# Patient Record
Sex: Female | Born: 1950 | Race: White | Hispanic: No | Marital: Single | Smoking: Former smoker
Health system: Southern US, Community
[De-identification: ages and names within clinical notes are randomized; demographics above are authoritative.]

## PROBLEM LIST (undated history)

## (undated) DIAGNOSIS — M1990 Unspecified osteoarthritis, unspecified site: Secondary | ICD-10-CM

## (undated) DIAGNOSIS — E039 Hypothyroidism, unspecified: Secondary | ICD-10-CM

## (undated) DIAGNOSIS — I10 Essential (primary) hypertension: Secondary | ICD-10-CM

## (undated) DIAGNOSIS — E785 Hyperlipidemia, unspecified: Secondary | ICD-10-CM

## (undated) HISTORY — PX: BASAL CELL CARCINOMA EXCISION: SHX1214

## (undated) HISTORY — PX: TONSILLECTOMY: SUR1361

---

## 2016-02-29 ENCOUNTER — Other Ambulatory Visit: Payer: Self-pay | Admitting: Orthopaedic Surgery

## 2016-03-15 ENCOUNTER — Encounter (HOSPITAL_COMMUNITY)
Admission: RE | Admit: 2016-03-15 | Discharge: 2016-03-15 | Disposition: A | Discharge status: Home / Self Care | Payer: Medicare Other | Source: Ambulatory Visit | Attending: Orthopaedic Surgery | Admitting: Orthopaedic Surgery

## 2016-03-15 ENCOUNTER — Ambulatory Visit (HOSPITAL_COMMUNITY)
Admission: RE | Admit: 2016-03-15 | Discharge: 2016-03-15 | Disposition: A | Discharge status: Home / Self Care | Payer: Medicare Other | Source: Ambulatory Visit | Attending: Orthopaedic Surgery | Admitting: Orthopaedic Surgery

## 2016-03-15 ENCOUNTER — Encounter (HOSPITAL_COMMUNITY): Payer: Self-pay

## 2016-03-15 DIAGNOSIS — Z01818 Encounter for other preprocedural examination: Secondary | ICD-10-CM | POA: Insufficient documentation

## 2016-03-15 DIAGNOSIS — Z01812 Encounter for preprocedural laboratory examination: Secondary | ICD-10-CM | POA: Insufficient documentation

## 2016-03-15 DIAGNOSIS — I700 Atherosclerosis of aorta: Secondary | ICD-10-CM | POA: Diagnosis not present

## 2016-03-15 DIAGNOSIS — Z01810 Encounter for preprocedural cardiovascular examination: Secondary | ICD-10-CM | POA: Diagnosis present

## 2016-03-15 HISTORY — DX: Hypothyroidism, unspecified: E03.9

## 2016-03-15 HISTORY — DX: Hyperlipidemia, unspecified: E78.5

## 2016-03-15 HISTORY — DX: Essential (primary) hypertension: I10

## 2016-03-15 LAB — CBC WITH DIFFERENTIAL/PLATELET
BASOS ABS: 0 10*3/uL (ref 0.0–0.1)
BASOS PCT: 0 %
EOS ABS: 0.1 10*3/uL (ref 0.0–0.7)
Eosinophils Relative: 1 %
HCT: 47.1 % — ABNORMAL HIGH (ref 36.0–46.0)
HEMOGLOBIN: 16 g/dL — AB (ref 12.0–15.0)
Lymphocytes Relative: 24 %
Lymphs Abs: 2.8 10*3/uL (ref 0.7–4.0)
MCH: 32.4 pg (ref 26.0–34.0)
MCHC: 34 g/dL (ref 30.0–36.0)
MCV: 95.3 fL (ref 78.0–100.0)
MONO ABS: 0.6 10*3/uL (ref 0.1–1.0)
MONOS PCT: 5 %
NEUTROS PCT: 70 %
Neutro Abs: 8 10*3/uL — ABNORMAL HIGH (ref 1.7–7.7)
Platelets: 234 10*3/uL (ref 150–400)
RBC: 4.94 MIL/uL (ref 3.87–5.11)
RDW: 12.9 % (ref 11.5–15.5)
WBC: 11.6 10*3/uL — ABNORMAL HIGH (ref 4.0–10.5)

## 2016-03-15 LAB — URINALYSIS, ROUTINE W REFLEX MICROSCOPIC
BACTERIA UA: NONE SEEN
BILIRUBIN URINE: NEGATIVE
Glucose, UA: NEGATIVE mg/dL
KETONES UR: NEGATIVE mg/dL
LEUKOCYTES UA: NEGATIVE
Nitrite: NEGATIVE
PROTEIN: NEGATIVE mg/dL
Specific Gravity, Urine: 1.016 (ref 1.005–1.030)
pH: 6 (ref 5.0–8.0)

## 2016-03-15 LAB — BASIC METABOLIC PANEL
ANION GAP: 11 (ref 5–15)
BUN: 21 mg/dL — ABNORMAL HIGH (ref 6–20)
CALCIUM: 9.4 mg/dL (ref 8.9–10.3)
CO2: 19 mmol/L — AB (ref 22–32)
CREATININE: 0.8 mg/dL (ref 0.44–1.00)
Chloride: 107 mmol/L (ref 101–111)
Glucose, Bld: 101 mg/dL — ABNORMAL HIGH (ref 65–99)
Potassium: 4.2 mmol/L (ref 3.5–5.1)
SODIUM: 137 mmol/L (ref 135–145)

## 2016-03-15 LAB — ABO/RH: ABO/RH(D): O POS

## 2016-03-15 LAB — APTT: APTT: 29 s (ref 24–36)

## 2016-03-15 LAB — PROTIME-INR
INR: 1
PROTHROMBIN TIME: 13.2 s (ref 11.4–15.2)

## 2016-03-15 LAB — SURGICAL PCR SCREEN
MRSA, PCR: NEGATIVE
STAPHYLOCOCCUS AUREUS: NEGATIVE

## 2016-03-15 LAB — TYPE AND SCREEN
ABO/RH(D): O POS
Antibody Screen: NEGATIVE

## 2016-03-15 NOTE — Pre-Procedure Instructions (Signed)
    Tami Baker  03/15/2016      [facility] [address] - [place], [place] - [address] [place] [id number] Phone: [phone] Fax: [phone]    Your procedure is scheduled on March 26, 2016.  [facility].M.  Call this number if you have problems the morning of surgery:  [phone]   Remember:  Do not eat food or drink liquids after midnight Monday 1/15.  Take these medicines the morning of surgery with A SIP OF WATER :levothyroxine (SYNTHROID, LEVOTHROID)   STOP ASPIRIN, HERBAL MEDICATIONS, FISH OIL, NSAID'S (ALEVE, ADVIL, IBUPROFEN) ONE WEEK PRIOR TO SURGERY    Do not wear jewelry, make-up or nail polish.  Do not wear lotions, powders, or perfumes, or deoderant.  Do not shave 48 hours prior to surgery.    Do not bring valuables to the hospital.  [facility] is not responsible for any belongings or valuables.  Contacts, dentures or bridgework may not be worn into surgery.  Leave your suitcase in the car.  After surgery it may be brought to your room.  For patients admitted to the hospital, discharge time will be determined by your treatment team.  Patients discharged the day of surgery will not be allowed to drive home.      Special instructions:  PREPARING FOR SURGERY  Please read over the following fact sheets that you were given. Pain Booklet and Surgical Site Infection Prevention

## 2016-03-15 NOTE — Progress Notes (Deleted)
Pt states he was instructed by Dr Randie Heinzain not to stop plavix.  Voicemail left with [place].

## 2016-03-25 NOTE — H&P (Signed)
TOTAL HIP ADMISSION H&P  Patient is admitted for right total hip arthroplasty.  Subjective:  Chief Complaint: right hip pain  HPI: Tami Baker, 66 y.o. female, has a history of pain and functional disability in the right hip(s) due to arthritis and patient has failed non-surgical conservative treatments for greater than 12 weeks to include NSAID's and/or analgesics, corticosteriod injections, flexibility and strengthening excercises, use of assistive devices, weight reduction as appropriate and activity modification.  Onset of symptoms was gradual starting 5 years ago with gradually worsening course since that time.The patient noted no past surgery on the right hip(s).  Patient currently rates pain in the right hip at 10 out of 10 with activity. Patient has night pain, worsening of pain with activity and weight bearing, trendelenberg gait, pain that interfers with activities of daily living and crepitus. Patient has evidence of subchondral cysts, subchondral sclerosis, periarticular osteophytes and joint space narrowing by imaging studies. This condition presents safety issues increasing the risk of falls. There is no current active infection.  There are no active problems to display for this patient.  Past Medical History:  Diagnosis Date  . Hyperlipemia   . Hypertension   . Hypothyroidism     Past Surgical History:  Procedure Laterality Date  . BASAL CELL CARCINOMA EXCISION Right    right mandible  . TONSILLECTOMY      No prescriptions prior to admission.   Allergies  Allergen Reactions  . Ciprofloxacin Hives and Shortness Of Breath  . Codeine Hives and Shortness Of Breath  . Iodine Hives and Shortness Of Breath  . Ivp Dye [Iodinated Diagnostic Agents] Hives and Shortness Of Breath  . Levaquin [Levofloxacin] Hives and Shortness Of Breath  . Penicillins Hives and Shortness Of Breath    Has patient had a PCN reaction causing immediate rash, facial/tongue/throat swelling, SOB or  lightheadedness with hypotension: Yes Has patient had a PCN reaction causing severe rash involving mucus membranes or skin necrosis: No Has patient had a PCN reaction that required hospitalization No Has patient had a PCN reaction occurring within the last 10 years: Yes If all of the above answers are "NO", then may proceed with Cephalosporin use.   . Shellfish Allergy Hives and Shortness Of Breath    Social History  Substance Use Topics  . Smoking status: Not on file  . Smokeless tobacco: Not on file  . Alcohol use Not on file    No family history on file.   Review of Systems  Musculoskeletal: Positive for joint pain.       Right hip  All other systems reviewed and are negative.   Objective:  Physical Exam  Constitutional: She is oriented to person, place, and time. She appears well-developed and well-nourished.  HENT:  Head: Normocephalic and atraumatic.  Eyes: Pupils are equal, round, and reactive to light.  Neck: Normal range of motion.  Cardiovascular: Normal rate and regular rhythm.   Respiratory: Effort normal.  GI: Soft.  Musculoskeletal:  Right hip has limited motion all of which is somewhat painful.  Her right leg is slightly short compared to the left.  There is some pain across the greater trochanteric bursa as well.  Straight leg raise causes hip pain only.  Sensation and motor function are intact distally.  She has palpable pulses in her feet.  The only scar on her leg is a small one by her knee related to an old laceration.  There is no palpable lymphadenopathy behind her knee or at the  groin.    Neurological: She is alert and oriented to person, place, and time.  Skin: Skin is warm and dry.  Psychiatric: She has a normal mood and affect. Her behavior is normal. Judgment and thought content normal.    Vital signs in last 24 hours:    Labs:   Estimated body mass index is 28.19 kg/m as calculated from the following:   Height as of 03/15/16: 5' 6.5" (1.689  m).   Weight as of 03/15/16: 80.4 kg (177 lb 4.8 oz).   Imaging Review Plain radiographs demonstrate severe degenerative joint disease of the right hip(s). The bone quality appears to be good for age and reported activity level.  Assessment/Plan:  End stage primary arthritis, right hip(s)  The patient history, physical examination, clinical judgement of the provider and imaging studies are consistent with end stage degenerative joint disease of the right hip(s) and total hip arthroplasty is deemed medically necessary. The treatment options including medical management, injection therapy, arthroscopy and arthroplasty were discussed at length. The risks and benefits of total hip arthroplasty were presented and reviewed. The risks due to aseptic loosening, infection, stiffness, dislocation/subluxation,  thromboembolic complications and other imponderables were discussed.  The patient acknowledged the explanation, agreed to proceed with the plan and consent was signed. Patient is being admitted for inpatient treatment for surgery, pain control, PT, OT, prophylactic antibiotics, VTE prophylaxis, progressive ambulation and ADL's and discharge planning.The patient is planning to be discharged home with home health services

## 2016-03-25 NOTE — Anesthesia Preprocedure Evaluation (Addendum)
Anesthesia Evaluation  Patient identified by MRN, date of birth, ID band Patient awake    Reviewed: Allergy & Precautions, NPO status , Patient's Chart, lab work & pertinent test results  History of Anesthesia Complications Negative for: history of anesthetic complications  Airway Mallampati: II  TM Distance: >3 FB Neck ROM: Full    Dental no notable dental hx. (+) Dental Advisory Given   Pulmonary neg pulmonary ROS, former smoker,    Pulmonary exam normal        Cardiovascular hypertension, Pt. on medications Normal cardiovascular exam     Neuro/Psych negative neurological ROS  negative psych ROS   GI/Hepatic negative GI ROS, Neg liver ROS,   Endo/Other  Hypothyroidism   Renal/GU negative Renal ROS     Musculoskeletal negative musculoskeletal ROS (+)   Abdominal   Peds  Hematology negative hematology ROS (+)   Anesthesia Other Findings Day of surgery medications reviewed with the patient.  Reproductive/Obstetrics                            Anesthesia Physical Anesthesia Plan  ASA: II  Anesthesia Plan: MAC and Spinal   Post-op Pain Management:    Induction:   Airway Management Planned: Natural Airway and Simple Face Mask  Additional Equipment:   Intra-op Plan:   Post-operative Plan:   Informed Consent: I have reviewed the patients History and Physical, chart, labs and discussed the procedure including the risks, benefits and alternatives for the proposed anesthesia with the patient or authorized representative who has indicated his/her understanding and acceptance.   Dental advisory given  Plan Discussed with: CRNA, Anesthesiologist and Surgeon  Anesthesia Plan Comments:        Anesthesia Quick Evaluation

## 2016-03-26 ENCOUNTER — Inpatient Hospital Stay (HOSPITAL_COMMUNITY)
Admission: RE | Admit: 2016-03-26 | Discharge: 2016-03-28 | DRG: 470 | Disposition: A | Discharge status: Home Health | Payer: Medicare Other | Source: Ambulatory Visit | Attending: Orthopaedic Surgery | Admitting: Orthopaedic Surgery

## 2016-03-26 ENCOUNTER — Inpatient Hospital Stay (HOSPITAL_COMMUNITY): Payer: Medicare Other

## 2016-03-26 ENCOUNTER — Inpatient Hospital Stay (HOSPITAL_COMMUNITY): Payer: Medicare Other | Admitting: Certified Registered Nurse Anesthetist

## 2016-03-26 ENCOUNTER — Encounter (HOSPITAL_COMMUNITY): Payer: Self-pay | Admitting: *Deleted

## 2016-03-26 ENCOUNTER — Inpatient Hospital Stay (HOSPITAL_COMMUNITY): Payer: Medicare Other | Admitting: Emergency Medicine

## 2016-03-26 ENCOUNTER — Encounter (HOSPITAL_COMMUNITY)
Admission: RE | Disposition: A | Discharge status: Home Health | Payer: Self-pay | Source: Ambulatory Visit | Attending: Orthopaedic Surgery

## 2016-03-26 DIAGNOSIS — Z91041 Radiographic dye allergy status: Secondary | ICD-10-CM

## 2016-03-26 DIAGNOSIS — Z880 Allergy status to penicillin: Secondary | ICD-10-CM

## 2016-03-26 DIAGNOSIS — Z85828 Personal history of other malignant neoplasm of skin: Secondary | ICD-10-CM | POA: Diagnosis not present

## 2016-03-26 DIAGNOSIS — Z91013 Allergy to seafood: Secondary | ICD-10-CM | POA: Diagnosis not present

## 2016-03-26 DIAGNOSIS — M1611 Unilateral primary osteoarthritis, right hip: Principal | ICD-10-CM | POA: Diagnosis present

## 2016-03-26 DIAGNOSIS — E785 Hyperlipidemia, unspecified: Secondary | ICD-10-CM | POA: Diagnosis present

## 2016-03-26 DIAGNOSIS — Z419 Encounter for procedure for purposes other than remedying health state, unspecified: Secondary | ICD-10-CM

## 2016-03-26 DIAGNOSIS — Z881 Allergy status to other antibiotic agents status: Secondary | ICD-10-CM | POA: Diagnosis not present

## 2016-03-26 DIAGNOSIS — Z885 Allergy status to narcotic agent status: Secondary | ICD-10-CM | POA: Diagnosis not present

## 2016-03-26 DIAGNOSIS — E039 Hypothyroidism, unspecified: Secondary | ICD-10-CM | POA: Diagnosis present

## 2016-03-26 DIAGNOSIS — I10 Essential (primary) hypertension: Secondary | ICD-10-CM | POA: Diagnosis present

## 2016-03-26 HISTORY — PX: TOTAL HIP ARTHROPLASTY: SHX124

## 2016-03-26 HISTORY — DX: Unspecified osteoarthritis, unspecified site: M19.90

## 2016-03-26 SURGERY — ARTHROPLASTY, HIP, TOTAL, ANTERIOR APPROACH
Anesthesia: Monitor Anesthesia Care | Site: Hip | Laterality: Right

## 2016-03-26 MED ORDER — BISACODYL 5 MG PO TBEC
5.0000 mg | DELAYED_RELEASE_TABLET | Freq: Every day | ORAL | Status: DC | PRN
  Administered 2016-03-27: 5 mg via ORAL
  Filled 2016-03-26: qty 1

## 2016-03-26 MED ORDER — MIDAZOLAM HCL 2 MG/2ML IJ SOLN
INTRAMUSCULAR | Status: AC
  Filled 2016-03-26: qty 2

## 2016-03-26 MED ORDER — FENTANYL CITRATE (PF) 100 MCG/2ML IJ SOLN
INTRAMUSCULAR | Status: AC
  Filled 2016-03-26: qty 2

## 2016-03-26 MED ORDER — TRAMADOL HCL 50 MG PO TABS
50.0000 mg | ORAL_TABLET | Freq: Four times a day (QID) | ORAL | Status: DC
  Administered 2016-03-26 – 2016-03-27 (×3): 100 mg via ORAL
  Filled 2016-03-26: qty 2
  Filled 2016-03-26: qty 1
  Filled 2016-03-26 (×2): qty 2
  Filled 2016-03-26: qty 1

## 2016-03-26 MED ORDER — LACTATED RINGERS IV SOLN
INTRAVENOUS | Status: DC
  Administered 2016-03-26: 07:00:00 via INTRAVENOUS

## 2016-03-26 MED ORDER — PHENYLEPHRINE 40 MCG/ML (10ML) SYRINGE FOR IV PUSH (FOR BLOOD PRESSURE SUPPORT)
PREFILLED_SYRINGE | INTRAVENOUS | Status: AC
  Filled 2016-03-26: qty 20

## 2016-03-26 MED ORDER — PROMETHAZINE HCL 25 MG/ML IJ SOLN: 6.2500 mg | INTRAMUSCULAR | Status: DC | PRN

## 2016-03-26 MED ORDER — ASPIRIN EC 325 MG PO TBEC
325.0000 mg | DELAYED_RELEASE_TABLET | Freq: Two times a day (BID) | ORAL | Status: DC
  Administered 2016-03-27 – 2016-03-28 (×3): 325 mg via ORAL
  Filled 2016-03-26 (×3): qty 1

## 2016-03-26 MED ORDER — MIDAZOLAM HCL 5 MG/5ML IJ SOLN
INTRAMUSCULAR | Status: DC | PRN
  Administered 2016-03-26: 2 mg via INTRAVENOUS

## 2016-03-26 MED ORDER — HYDROMORPHONE HCL 2 MG/ML IJ SOLN
0.5000 mg | INTRAMUSCULAR | Status: DC | PRN
  Administered 2016-03-26: 1 mg via INTRAVENOUS
  Filled 2016-03-26 (×2): qty 1

## 2016-03-26 MED ORDER — BUPIVACAINE LIPOSOME 1.3 % IJ SUSP
INTRAMUSCULAR | Status: DC | PRN
  Administered 2016-03-26: 20 mL

## 2016-03-26 MED ORDER — LACTATED RINGERS IV SOLN
INTRAVENOUS | Status: DC
  Administered 2016-03-26: 17:00:00 via INTRAVENOUS

## 2016-03-26 MED ORDER — ONDANSETRON HCL 4 MG PO TABS: 4.0000 mg | ORAL_TABLET | Freq: Four times a day (QID) | ORAL | Status: DC | PRN

## 2016-03-26 MED ORDER — TRANEXAMIC ACID 1000 MG/10ML IV SOLN
INTRAVENOUS | Status: DC | PRN
  Administered 2016-03-26: 2000 mg via TOPICAL

## 2016-03-26 MED ORDER — DIPHENHYDRAMINE HCL 12.5 MG/5ML PO ELIX: 12.5000 mg | ORAL_SOLUTION | ORAL | Status: DC | PRN

## 2016-03-26 MED ORDER — METOCLOPRAMIDE HCL 5 MG PO TABS: 5.0000 mg | ORAL_TABLET | Freq: Three times a day (TID) | ORAL | Status: DC | PRN

## 2016-03-26 MED ORDER — LOSARTAN POTASSIUM 50 MG PO TABS
50.0000 mg | ORAL_TABLET | Freq: Every day | ORAL | Status: DC
  Administered 2016-03-27 – 2016-03-28 (×2): 50 mg via ORAL
  Filled 2016-03-26 (×3): qty 1

## 2016-03-26 MED ORDER — CLINDAMYCIN PHOSPHATE 600 MG/50ML IV SOLN
600.0000 mg | Freq: Four times a day (QID) | INTRAVENOUS | Status: AC
  Administered 2016-03-26 (×2): 600 mg via INTRAVENOUS
  Filled 2016-03-26 (×2): qty 50

## 2016-03-26 MED ORDER — BUPIVACAINE-EPINEPHRINE (PF) 0.5% -1:200000 IJ SOLN
INTRAMUSCULAR | Status: DC | PRN
  Administered 2016-03-26: 20 mL

## 2016-03-26 MED ORDER — DOCUSATE SODIUM 100 MG PO CAPS
100.0000 mg | ORAL_CAPSULE | Freq: Two times a day (BID) | ORAL | Status: DC
  Administered 2016-03-26 – 2016-03-28 (×5): 100 mg via ORAL
  Filled 2016-03-26 (×5): qty 1

## 2016-03-26 MED ORDER — BUPIVACAINE-EPINEPHRINE (PF) 0.5% -1:200000 IJ SOLN
INTRAMUSCULAR | Status: AC
  Filled 2016-03-26: qty 30

## 2016-03-26 MED ORDER — PROPOFOL 10 MG/ML IV BOLUS
INTRAVENOUS | Status: DC | PRN
  Administered 2016-03-26: 20 mg via INTRAVENOUS

## 2016-03-26 MED ORDER — CHLORHEXIDINE GLUCONATE 4 % EX LIQD: 60.0000 mL | Freq: Once | CUTANEOUS | Status: DC

## 2016-03-26 MED ORDER — FENTANYL CITRATE (PF) 100 MCG/2ML IJ SOLN
INTRAMUSCULAR | Status: DC | PRN
  Administered 2016-03-26: 100 ug via INTRAVENOUS

## 2016-03-26 MED ORDER — CLINDAMYCIN PHOSPHATE 900 MG/50ML IV SOLN
900.0000 mg | INTRAVENOUS | Status: AC
  Administered 2016-03-26: 900 mg via INTRAVENOUS
  Filled 2016-03-26: qty 50

## 2016-03-26 MED ORDER — POTASSIUM GLUCONATE 550 MG PO TABS: 550.0000 mg | ORAL_TABLET | Freq: Every day | ORAL | Status: DC

## 2016-03-26 MED ORDER — METHOCARBAMOL 1000 MG/10ML IJ SOLN
500.0000 mg | Freq: Four times a day (QID) | INTRAVENOUS | Status: DC | PRN
  Filled 2016-03-26: qty 5

## 2016-03-26 MED ORDER — METHOCARBAMOL 500 MG PO TABS
ORAL_TABLET | ORAL | Status: AC
  Filled 2016-03-26: qty 1

## 2016-03-26 MED ORDER — MENTHOL 3 MG MT LOZG: 1.0000 | LOZENGE | OROMUCOSAL | Status: DC | PRN

## 2016-03-26 MED ORDER — ALUM & MAG HYDROXIDE-SIMETH 200-200-20 MG/5ML PO SUSP: 30.0000 mL | ORAL | Status: DC | PRN

## 2016-03-26 MED ORDER — MAGNESIUM GLUCONATE 500 MG PO TABS
500.0000 mg | ORAL_TABLET | Freq: Every day | ORAL | Status: DC
  Administered 2016-03-26 – 2016-03-28 (×3): 500 mg via ORAL
  Filled 2016-03-26 (×3): qty 1

## 2016-03-26 MED ORDER — LEVOTHYROXINE SODIUM 50 MCG PO TABS
50.0000 ug | ORAL_TABLET | Freq: Every day | ORAL | Status: DC
  Administered 2016-03-27 – 2016-03-28 (×2): 50 ug via ORAL
  Filled 2016-03-26 (×2): qty 1

## 2016-03-26 MED ORDER — PROPOFOL 500 MG/50ML IV EMUL
INTRAVENOUS | Status: DC | PRN
  Administered 2016-03-26: 50 ug/kg/min via INTRAVENOUS

## 2016-03-26 MED ORDER — PHENOL 1.4 % MT LIQD: 1.0000 | OROMUCOSAL | Status: DC | PRN

## 2016-03-26 MED ORDER — TRANEXAMIC ACID 1000 MG/10ML IV SOLN
2000.0000 mg | Freq: Once | INTRAVENOUS | Status: DC
  Filled 2016-03-26: qty 20

## 2016-03-26 MED ORDER — PHENYLEPHRINE HCL 10 MG/ML IJ SOLN
INTRAMUSCULAR | Status: DC | PRN
  Administered 2016-03-26 (×3): 80 ug via INTRAVENOUS
  Administered 2016-03-26: 120 ug via INTRAVENOUS
  Administered 2016-03-26 (×2): 80 ug via INTRAVENOUS
  Administered 2016-03-26: 40 ug via INTRAVENOUS
  Administered 2016-03-26 (×5): 80 ug via INTRAVENOUS

## 2016-03-26 MED ORDER — PRAVASTATIN SODIUM 20 MG PO TABS
20.0000 mg | ORAL_TABLET | Freq: Every day | ORAL | Status: DC
  Administered 2016-03-26 – 2016-03-27 (×2): 20 mg via ORAL
  Filled 2016-03-26 (×2): qty 1

## 2016-03-26 MED ORDER — EPHEDRINE SULFATE 50 MG/ML IJ SOLN
INTRAMUSCULAR | Status: DC | PRN
  Administered 2016-03-26: 10 mg via INTRAVENOUS
  Administered 2016-03-26 (×4): 5 mg via INTRAVENOUS

## 2016-03-26 MED ORDER — 0.9 % SODIUM CHLORIDE (POUR BTL) OPTIME
TOPICAL | Status: DC | PRN
  Administered 2016-03-26: 1000 mL

## 2016-03-26 MED ORDER — ACETAMINOPHEN 650 MG RE SUPP: 650.0000 mg | Freq: Four times a day (QID) | RECTAL | Status: DC | PRN

## 2016-03-26 MED ORDER — HYDROMORPHONE HCL 1 MG/ML IJ SOLN
0.2500 mg | INTRAMUSCULAR | Status: DC | PRN
  Administered 2016-03-26: 0.5 mg via INTRAVENOUS

## 2016-03-26 MED ORDER — HYDROCHLOROTHIAZIDE 12.5 MG PO CAPS
12.5000 mg | ORAL_CAPSULE | Freq: Every day | ORAL | Status: DC
  Administered 2016-03-27 – 2016-03-28 (×2): 12.5 mg via ORAL
  Filled 2016-03-26 (×3): qty 1

## 2016-03-26 MED ORDER — METOCLOPRAMIDE HCL 5 MG/ML IJ SOLN
5.0000 mg | Freq: Three times a day (TID) | INTRAMUSCULAR | Status: DC | PRN
  Administered 2016-03-26: 10 mg via INTRAVENOUS
  Filled 2016-03-26: qty 2

## 2016-03-26 MED ORDER — METHOCARBAMOL 500 MG PO TABS
500.0000 mg | ORAL_TABLET | Freq: Four times a day (QID) | ORAL | Status: DC | PRN
  Administered 2016-03-26 – 2016-03-28 (×6): 500 mg via ORAL
  Filled 2016-03-26 (×5): qty 1

## 2016-03-26 MED ORDER — BUPIVACAINE LIPOSOME 1.3 % IJ SUSP
20.0000 mL | INTRAMUSCULAR | Status: DC
  Filled 2016-03-26: qty 20

## 2016-03-26 MED ORDER — HYDROMORPHONE HCL 1 MG/ML IJ SOLN
INTRAMUSCULAR | Status: AC
  Filled 2016-03-26: qty 0.5

## 2016-03-26 MED ORDER — TRANEXAMIC ACID 1000 MG/10ML IV SOLN
1000.0000 mg | Freq: Once | INTRAVENOUS | Status: AC
  Administered 2016-03-26: 1000 mg via INTRAVENOUS
  Filled 2016-03-26: qty 10

## 2016-03-26 MED ORDER — ONDANSETRON HCL 4 MG/2ML IJ SOLN
4.0000 mg | Freq: Four times a day (QID) | INTRAMUSCULAR | Status: DC | PRN
  Administered 2016-03-26: 4 mg via INTRAVENOUS
  Filled 2016-03-26: qty 2

## 2016-03-26 MED ORDER — TRANEXAMIC ACID 1000 MG/10ML IV SOLN
1000.0000 mg | INTRAVENOUS | Status: AC
  Administered 2016-03-26: 1000 mg via INTRAVENOUS
  Filled 2016-03-26: qty 10

## 2016-03-26 MED ORDER — LOSARTAN POTASSIUM-HCTZ 50-12.5 MG PO TABS: 1.0000 | ORAL_TABLET | Freq: Every day | ORAL | Status: DC

## 2016-03-26 MED ORDER — EPHEDRINE 5 MG/ML INJ
INTRAVENOUS | Status: AC
  Filled 2016-03-26: qty 10

## 2016-03-26 MED ORDER — ACETAMINOPHEN 325 MG PO TABS
650.0000 mg | ORAL_TABLET | Freq: Four times a day (QID) | ORAL | Status: DC | PRN
  Administered 2016-03-27 – 2016-03-28 (×2): 650 mg via ORAL
  Filled 2016-03-26 (×2): qty 2

## 2016-03-26 MED ORDER — BUPIVACAINE IN DEXTROSE 0.75-8.25 % IT SOLN
INTRATHECAL | Status: DC | PRN
  Administered 2016-03-26: 15 mg via INTRATHECAL

## 2016-03-26 SURGICAL SUPPLY — 47 items
BAG DECANTER FOR FLEXI CONT (MISCELLANEOUS) ×3 IMPLANT
BLADE SAW SGTL 18X1.27X75 (BLADE) ×2 IMPLANT
BLADE SAW SGTL 18X1.27X75MM (BLADE) ×1
CAPT HIP TOTAL 2 ×3 IMPLANT
CELLS DAT CNTRL 66122 CELL SVR (MISCELLANEOUS) ×1 IMPLANT
CHLORAPREP W/TINT 26ML (MISCELLANEOUS) ×3 IMPLANT
CLOSURE WOUND 1/2 X4 (GAUZE/BANDAGES/DRESSINGS) ×1
COVER PERINEAL POST (MISCELLANEOUS) ×3 IMPLANT
COVER SURGICAL LIGHT HANDLE (MISCELLANEOUS) ×3 IMPLANT
DRAPE C-ARM 42X72 X-RAY (DRAPES) ×3 IMPLANT
DRAPE INCISE 13X13 STRL (DRAPES) ×3 IMPLANT
DRAPE STERI IOBAN 125X83 (DRAPES) IMPLANT
DRAPE U-SHAPE 47X51 STRL (DRAPES) ×9 IMPLANT
DRSG AQUACEL AG ADV 3.5X10 (GAUZE/BANDAGES/DRESSINGS) ×3 IMPLANT
DURAPREP 26ML APPLICATOR (WOUND CARE) IMPLANT
ELECT BLADE 4.0 EZ CLEAN MEGAD (MISCELLANEOUS) ×3
ELECT REM PT RETURN 9FT ADLT (ELECTROSURGICAL) ×3
ELECTRODE BLDE 4.0 EZ CLN MEGD (MISCELLANEOUS) ×1 IMPLANT
ELECTRODE REM PT RTRN 9FT ADLT (ELECTROSURGICAL) ×1 IMPLANT
FACESHIELD WRAPAROUND (MASK) ×6 IMPLANT
GLOVE BIO SURGEON STRL SZ8 (GLOVE) ×6 IMPLANT
GLOVE BIOGEL PI IND STRL 8 (GLOVE) ×2 IMPLANT
GLOVE BIOGEL PI INDICATOR 8 (GLOVE) ×4
GOWN STRL REUS W/ TWL LRG LVL3 (GOWN DISPOSABLE) ×1 IMPLANT
GOWN STRL REUS W/ TWL XL LVL3 (GOWN DISPOSABLE) ×2 IMPLANT
GOWN STRL REUS W/TWL LRG LVL3 (GOWN DISPOSABLE) ×2
GOWN STRL REUS W/TWL XL LVL3 (GOWN DISPOSABLE) ×4
KIT BASIN OR (CUSTOM PROCEDURE TRAY) ×3 IMPLANT
KIT ROOM TURNOVER OR (KITS) ×3 IMPLANT
MANIFOLD NEPTUNE II (INSTRUMENTS) ×3 IMPLANT
NEEDLE HYPO 22GX1.5 SAFETY (NEEDLE) ×3 IMPLANT
NS IRRIG 1000ML POUR BTL (IV SOLUTION) ×3 IMPLANT
PACK TOTAL JOINT (CUSTOM PROCEDURE TRAY) ×3 IMPLANT
PAD ARMBOARD 7.5X6 YLW CONV (MISCELLANEOUS) ×3 IMPLANT
RTRCTR WOUND ALEXIS 18CM MED (MISCELLANEOUS) ×3
STRIP CLOSURE SKIN 1/2X4 (GAUZE/BANDAGES/DRESSINGS) ×2 IMPLANT
SUT ETHIBOND NAB CT1 #1 30IN (SUTURE) ×6 IMPLANT
SUT MNCRL AB 3-0 PS2 27 (SUTURE) ×3 IMPLANT
SUT VIC AB 1 CT1 27 (SUTURE) ×2
SUT VIC AB 1 CT1 27XBRD ANBCTR (SUTURE) ×1 IMPLANT
SUT VIC AB 2-0 CT1 27 (SUTURE) ×2
SUT VIC AB 2-0 CT1 TAPERPNT 27 (SUTURE) ×1 IMPLANT
SUT VLOC 180 0 24IN GS25 (SUTURE) ×3 IMPLANT
SYR 50ML LL SCALE MARK (SYRINGE) ×3 IMPLANT
TOWEL OR 17X24 6PK STRL BLUE (TOWEL DISPOSABLE) ×3 IMPLANT
TOWEL OR 17X26 10 PK STRL BLUE (TOWEL DISPOSABLE) ×3 IMPLANT
TRAY CATH 16FR W/PLASTIC CATH (SET/KITS/TRAYS/PACK) IMPLANT

## 2016-03-26 NOTE — Anesthesia Procedure Notes (Signed)
Spinal  Patient location during procedure: OR Start time: 03/26/2016 7:26 AM End time: 03/26/2016 7:36 AM Staffing Anesthesiologist: Heather RobertsSINGER, Tami Kooyman Performed: anesthesiologist  Preanesthetic Checklist Completed: patient identified, surgical consent, pre-op evaluation, timeout performed, IV checked, risks and benefits discussed and monitors and equipment checked Spinal Block Patient position: sitting Prep: DuraPrep Patient monitoring: cardiac monitor, continuous pulse ox and blood pressure Approach: midline Location: L2-3 Injection technique: single-shot Needle Needle type: Pencan  Needle gauge: 24 G Needle length: 9 cm Additional Notes Functioning IV was confirmed and monitors were applied. Sterile prep and drape, including hand hygiene and sterile gloves were used. The patient was positioned and the spine was prepped. The skin was anesthetized with lidocaine.  Free flow of clear CSF was obtained prior to injecting local anesthetic into the CSF.  The spinal needle aspirated freely following injection.  The needle was carefully withdrawn.  The patient tolerated the procedure well.

## 2016-03-26 NOTE — Evaluation (Signed)
Physical Therapy Evaluation Patient Details Name: Tami Baker Janis MRN: [id number] DOB: 01/29/1951 Today's Date: 03/26/2016   History of Present Illness  Admitted for R THA, WBAT, Direct Ant;  has a past medical history of Hyperlipemia; Hypertension; and Hypothyroidism.    Clinical Impression   Pt is s/p THA resulting in the deficits listed below (see PT Problem List).  Pt will benefit from skilled PT to increase their independence and safety with mobility to allow discharge to the venue listed below.      Follow Up Recommendations Home health PT;Supervision/Assistance - 24 hour    Equipment Recommendations  Rolling walker with 5" wheels;3in1 (PT)    Recommendations for Other Services OT consult     Precautions / Restrictions Precautions Precautions: None Restrictions RLE Weight Bearing: Weight bearing as tolerated      Mobility  Bed Mobility Overal bed mobility: Needs Assistance Bed Mobility: Supine to Sit     Supine to sit: Min guard     General bed mobility comments: Cues for technqiue  Transfers Overall transfer level: Needs assistance Equipment used: Rolling walker (2 wheeled) Transfers: Sit to/from Stand Sit to Stand: Min guard         General transfer comment: Cues for hand placement and sfatey  Ambulation/Gait Ambulation/Gait assistance: Min assist Ambulation Distance (Feet): 10 Feet Assistive device: Rolling walker (2 wheeled) Gait Pattern/deviations: Step-through pattern     General Gait Details: Cues to self-monitor for activity tolerance; Became nauseated during walk  Stairs            Wheelchair Mobility    Modified Rankin (Stroke Patients Only)       Balance                                             Pertinent Vitals/Pain Pain Assessment: 0-10 Pain Score: 3  Pain Location: R hip Pain Descriptors / Indicators: Aching Pain Intervention(s): Monitored during session    Home Living Family/patient expects to  be discharged to:: Private residence Living Arrangements: Spouse/significant other Available Help at Discharge: Family;Available 24 hours/day Type of Home: House Home Access: Stairs to enter Entrance Stairs-Rails: None Entrance Stairs-Number of Steps: 1 Home Layout: Two level;Bed/bath upstairs        Prior Function Level of Independence: Independent               Hand Dominance        Extremity/Trunk Assessment   Upper Extremity Assessment Upper Extremity Assessment: Overall WFL for tasks assessed    Lower Extremity Assessment Lower Extremity Assessment: RLE deficits/detail RLE Deficits / Details: Grossly decr AROM and strength, limited by soreness postop    Cervical / Trunk Assessment Cervical / Trunk Assessment: Normal  Communication   Communication: No difficulties  Cognition Arousal/Alertness: Awake/alert Behavior During Therapy: WFL for tasks assessed/performed Overall Cognitive Status: Within Functional Limits for tasks assessed                      General Comments      Exercises Total Joint Exercises Ankle Circles/Pumps: AROM;Both;10 reps Quad Sets: AROM;Right;5 reps Gluteal Sets: AROM;Both;5 reps Towel Squeeze: AROM;Both;5 reps Heel Slides: AROM;Right;5 reps Hip ABduction/ADduction: AAROM;Right;5 reps   Assessment/Plan    PT Assessment Patient needs continued PT services  PT Problem List Decreased strength;Decreased range of motion;Decreased activity tolerance;Decreased balance;Decreased mobility;Decreased knowledge of use of DME;Decreased knowledge of  precautions;Pain          PT Treatment Interventions DME instruction;Gait training;Stair training;Functional mobility training;Therapeutic activities;Therapeutic exercise;Patient/family education    PT Goals (Current goals can be found in the Care Plan section)  Acute Rehab PT Goals Patient Stated Goal: walk without pain PT Goal Formulation: With patient Time For Goal Achievement:  04/02/16 Potential to Achieve Goals: Good    Frequency 7X/week   Barriers to discharge        Co-evaluation               End of Session Equipment Utilized During Treatment: Gait belt Activity Tolerance: Patient tolerated treatment well (limited by nausea) Patient left: in chair;with call bell/phone within reach;with family/visitor present Nurse Communication: Mobility status         Time: 8119-1478 PT Time Calculation (min) (ACUTE ONLY): 35 min   Charges:   PT Evaluation $PT Eval Low Complexity: 1 Procedure PT Treatments $Gait Training: 8-22 mins   PT G Codes:        Levi Aland 03/26/2016, 5:00 PM  [place], [place]  Acute Rehabilitation Services Pager [phone] Office [phone]

## 2016-03-26 NOTE — Anesthesia Postprocedure Evaluation (Addendum)
Anesthesia Post Note  Patient: Tami Baker  Procedure(s) Performed: Procedure(s) (LRB): TOTAL HIP ARTHROPLASTY ANTERIOR APPROACH (Right)  Patient location during evaluation: PACU Anesthesia Type: MAC Level of consciousness: awake and alert Pain management: pain level controlled Vital Signs Assessment: post-procedure vital signs reviewed and stable Respiratory status: spontaneous breathing and respiratory function stable Cardiovascular status: blood pressure returned to baseline and stable Postop Assessment: spinal receding Anesthetic complications: no       Last Vitals:  Vitals:   03/26/16 0957 03/26/16 1012  BP: (!) 107/58 (!) 112/55  Pulse: 60 (!) 50  Resp: (!) 22 17  Temp:      Last Pain:  Vitals:   03/26/16 0625  TempSrc: Oral  PainSc:                  Donie Lemelin DANIEL

## 2016-03-26 NOTE — Transfer of Care (Signed)
Immediate Anesthesia Transfer of Care Note  Patient: Tami Baker  Procedure(s) Performed: Procedure(s): TOTAL HIP ARTHROPLASTY ANTERIOR APPROACH (Right)  Patient Location: PACU  Anesthesia Type:MAC and Spinal  Level of Consciousness: awake, alert , oriented and patient cooperative  Airway & Oxygen Therapy: Patient Spontanous Breathing and Patient connected to nasal cannula oxygen  Post-op Assessment: Report given to RN and Post -op Vital signs reviewed and stable  Post vital signs: Reviewed and stable  Last Vitals:  Vitals:   03/26/16 0625  BP: (!) 151/62  Pulse: (!) 57  Resp: 20  Temp: 36.7 C    Last Pain:  Vitals:   03/26/16 0625  TempSrc: Oral  PainSc:          Complications: No apparent anesthesia complications

## 2016-03-26 NOTE — Op Note (Signed)
PRE-OP DIAGNOSIS:  RIGHT HIP DEGENERATIVE JOINT DISEASE POST-OP DIAGNOSIS:  same PROCEDURE: RIGHT TOTAL HIP ARTHROPLASTY ANTERIOR APPROACH ANESTHESIA:  Spinal SURGEON:  Marcene CorningPeter Tayshaun Kroh MD ASSISTANT:  Elodia FlorenceAndrew Nida PA-C   INDICATIONS FOR PROCEDURE:  The patient is a 66 y.o. female with a long history of a painful hip.  This has persisted despite multiple conservative measures.  The patient has persisted with pain and dysfunction making rest and activity difficult.  A total hip replacement is offered as surgical treatment.  Informed operative consent was obtained after discussion of possible complications including reaction to anesthesia, infection, neurovascular injury, dislocation, DVT, PE, and death.  The importance of the postoperative rehab program to optimize result was stressed with the patient.  SUMMARY OF FINDINGS AND PROCEDURE:  Under general anesthesia through a anterior approach an the Hana table a right THR was performed.  The patient had severe degenerative change and good bone quality.  We used DePuy components to replace the hip and these were size KLA 12 Corail femur capped with a +1 32mm ceramic hip ball.  On the acetabular side we used a size 50 Gription shell with a  plus 4 neutral polyethylene liner.  We did use a hole eliminator.  Elodia FlorenceAndrew Nida PA-C assisted throughout and was invaluable to the completion of the case in that he helped position and retract while I performed the procedure.  He also closed simultaneously to help minimize OR time.  I used fluoroscopy throughout the case to check position of implants and leg lengths and read all of these views myself.  DESCRIPTION OF PROCEDURE:  The patient was taken to the OR suite where general anesthetic was applied.  The patient was then positioned on the Hana table supine.  All bony prominences were appropriately padded.  Prep and drape was then performed in normal sterile fashion.  The patient was given clindamycin preoperative antibiotic  and an appropriate time out was performed.  We then took an anterior approach to the right hip.  Dissection was taken through adipose to the tensor fascia lata fascia.  This structure was incised longitudinally and we dissected in the intermuscular interval just medial to this muscle.  Cobra retractors were placed superior and inferior to the femoral neck superficial to the capsule.  A capsular incision was then made and the retractors were placed along the femoral neck.  Xray was brought in to get a good level for the femoral neck cut which was made with an oscillating saw and osteotome.  The femoral head was removed with a corkscrew.  The acetabulum was exposed and some labral tissues were excised. Reaming was taken to the inside wall of the pelvis and sequentially up to 1 mm smaller than the actual component.  A trial of components was done and then the aforementioned acetabular shell was placed in appropriate tilt and anteversion confirmed by fluoroscopy. The liner was placed along with the hole eliminator and attention was turned to the femur.  The leg was brought down and over into adduction and the elevator bar was used to raise the femur up gently in the wound.  The piriformis was released with care taken to preserve the obturator internus attachment and all of the posterior capsule. The femur was reamed and then broached to the appropriate size.  A trial reduction was done and the aforementioned head and neck assembly gave us the best stability in extension with external rotation.  Leg lengths were felt to be about equal by fluoroscopic exam.  The trial components were removed and the wound irrigated.  We then placed the femoral component in appropriate anteversion.  The head was applied to a dry stem neck and the hip again reduced.  It was again stable in the aforementioned position.  The would was irrigated again followed by re-approximation of anterior capsule with ethibond suture. Tensor fascia was  repaired with V-loc suture  followed by deep closure with #O and #2 undyed vicryl.  Skin was closed with subQ stitch and steristrips followed by a sterile dressing.  EBL and IOF can be obtained from anesthesia records.  DISPOSITION:  The patient was extubated in the OR and taken to PACU in stable condition to be admitted to the Orthopedic Surgery for appropriate post-op care to include perioperative antibiotics and DVT prophylaxis.

## 2016-03-26 NOTE — Interval H&P Note (Signed)
History and Physical Interval Note:  03/26/2016 7:04 AM  Tami Baker  has presented today for surgery, with the diagnosis of RIGHT HIP DEGENERATIVE JOINT DISEASE  The various methods of treatment have been discussed with the patient and family. After consideration of risks, benefits and other options for treatment, the patient has consented to  Procedure(s): TOTAL HIP ARTHROPLASTY ANTERIOR APPROACH (Right) as a surgical intervention .  The patient's history has been reviewed, patient examined, no change in status, stable for surgery.  I have reviewed the patient's chart and labs.  Questions were answered to the patient's satisfaction.     Shandora Koogler G

## 2016-03-27 ENCOUNTER — Encounter (HOSPITAL_COMMUNITY): Payer: Self-pay | Admitting: General Practice

## 2016-03-27 NOTE — Progress Notes (Signed)
Physical Therapy Treatment Patient Details Name: Tami Baker MRN: [id number] DOB: 10-18-1950 Today's Date: 03/27/2016    History of Present Illness Admitted for R THA, WBAT, Direct Ant;  has a past medical history of Hyperlipemia; Hypertension; and Hypothyroidism.      PT Comments    Feeling much better today and much better able to progress ambulation and therex; initiated stair training and discussed car transfer into a Jeep; on track for dc home tomorrow; Took a closer look at R ankle movement; full passive ROM into calf stretch; limited active ankle dorsiflexion; we discussed monitoring ankle range, and self calf stretch combined with active ankle dorsiflexion work  Follow Up Recommendations  Home health PT;Supervision/Assistance - 24 hour     Equipment Recommendations  Rolling walker with 5" wheels;3in1 (PT)    Recommendations for Other Services       Precautions / Restrictions Precautions Precautions: None Restrictions RLE Weight Bearing: Weight bearing as tolerated    Mobility  Bed Mobility Overal bed mobility: Needs Assistance Bed Mobility: Rolling;Sidelying to Sit Rolling: Supervision Sidelying to sit: Min guard       General bed mobility comments: Employed rolling and sidelie to sit technique to give pt more options for getting in and out of the bed; verbal and demo cues for technique  Transfers Overall transfer level: Needs assistance Equipment used: Rolling walker (2 wheeled) Transfers: Sit to/from Stand Sit to Stand: Min guard (without physical contact)         General transfer comment: Cues for hand placement and sfatey  Ambulation/Gait Ambulation/Gait assistance: Min guard Ambulation Distance (Feet): 60 Feet Assistive device: Rolling walker (2 wheeled) Gait Pattern/deviations: Step-through pattern, Decr Heel strike R     General Gait Details: Cues to self-monitor for activity tolerance; noted decr R ankle dorsiflexion with decr heel  strike   Stairs Stairs: Yes   Stair Management: One rail Right;Sideways;Step to pattern Number of Stairs: 2 General stair comments: verbal and demo cues for sequence and technique  Wheelchair Mobility    Modified Rankin (Stroke Patients Only)       Balance                                    Cognition Arousal/Alertness: Awake/alert Behavior During Therapy: WFL for tasks assessed/performed Overall Cognitive Status: Within Functional Limits for tasks assessed                      Exercises Total Joint Exercises Ankle Circles/Pumps: AROM;Both;10 reps (With noted decr active ankle dorsiflexion R) Quad Sets: AROM;Both;10 reps Gluteal Sets: AROM;Both;10 reps Towel Squeeze: AROM;Both;10 reps Heel Slides: AAROM;AROM;Right;10 reps Hip ABduction/ADduction: AROM;Right;10 reps Bridges: AROM;Both;10 reps    General Comments        Pertinent Vitals/Pain Pain Assessment: 0-10 Pain Score: 3  Pain Location: R hip Pain Descriptors / Indicators: Aching Pain Intervention(s): Monitored during session    Home Living                      Prior Function            PT Goals (current goals can now be found in the care plan section) Acute Rehab PT Goals Patient Stated Goal: walk without pain PT Goal Formulation: With patient Time For Goal Achievement: 04/02/16 Potential to Achieve Goals: Good Progress towards PT goals: Progressing toward goals    Frequency    7X/week  PT Plan Current plan remains appropriate    Co-evaluation             End of Session Equipment Utilized During Treatment: Gait belt Activity Tolerance: Patient tolerated treatment well Patient left: in chair;with call bell/phone within reach;with family/visitor present     Time: [id number] PT Time Calculation (min) (ACUTE ONLY): 42 min  Charges:  $Gait Training: 23-37 mins $Therapeutic Exercise: 8-22 mins                    G Codes:      Levi AlandHolly H  Jackquelyn Sundberg 03/27/2016, 10:29 AM  Van ClinesHolly Lisel Siegrist, PT  Acute Rehabilitation Services Pager [phone] Office [phone]

## 2016-03-27 NOTE — Evaluation (Signed)
Occupational Therapy Evaluation Patient Details Name: Tami Baker MRN: [id number] DOB: 07/13/1950 Today's Date: 03/27/2016    History of Present Illness Admitted for R THA, WBAT, Direct Ant;  has a past medical history of Hyperlipemia; Hypertension; and Hypothyroidism.     Clinical Impression   Pt with decline in function and safety with ADLs and ADL mobility with decreased balance and endurance. Pt would benefit from acute OT services to addrress impairments to increase level of function and safety    Follow Up Recommendations  Supervision - Intermittent    Equipment Recommendations  3 in 1 bedside commode;Other (comment);Tub/shower seat Lexicographer(reacher)    Recommendations for Other Services       Precautions / Restrictions Precautions Precautions: None Restrictions Weight Bearing Restrictions: Yes RLE Weight Bearing: Weight bearing as tolerated      Mobility Bed Mobility Overal bed mobility: Needs Assistance Bed Mobility: Rolling;Sidelying to Sit Rolling: Supervision Sidelying to sit: Min guard       General bed mobility comments: pt up in recliner  Transfers Overall transfer level: Needs assistance Equipment used: Rolling walker (2 wheeled) Transfers: Sit to/from Stand Sit to Stand: Min guard         General transfer comment: Cues for hand placement and sfatey    Balance                                            ADL Overall ADL's : Needs assistance/impaired     Grooming: Wash/dry hands;Wash/dry face;Standing;Min guard   Upper Body Bathing: Set up;Sitting   Lower Body Bathing: Moderate assistance   Upper Body Dressing : Set up;Sitting   Lower Body Dressing: Moderate assistance   Toilet Transfer: Min guard;Ambulation;RW;Comfort height toilet;Cueing for safety   Toileting- Clothing Manipulation and Hygiene: Minimal assistance;Sit to/from stand   Tub/ Shower Transfer: Min guard;3 in 1;Ambulation;Rolling walker;Cueing for  safety   Functional mobility during ADLs: Min guard;Cueing for safety       Vision Vision Assessment?: No apparent visual deficits          Pertinent Vitals/Pain Pain Assessment: 0-10 Pain Score: 3  Pain Location: R hip Pain Descriptors / Indicators: Aching;Sore Pain Intervention(s): Limited activity within patient's tolerance;Monitored during session;Premedicated before session;Repositioned;Ice applied     Hand Dominance Right   Extremity/Trunk Assessment Upper Extremity Assessment Upper Extremity Assessment: Overall WFL for tasks assessed   Lower Extremity Assessment Lower Extremity Assessment: Defer to PT evaluation   Cervical / Trunk Assessment Cervical / Trunk Assessment: Normal   Communication Communication Communication: No difficulties   Cognition Arousal/Alertness: Awake/alert Behavior During Therapy: WFL for tasks assessed/performed Overall Cognitive Status: Within Functional Limits for tasks assessed                     General Comments   pt pleasant and cooperative                Home Living Family/patient expects to be discharged to:: Private residence Living Arrangements: Spouse/significant other Available Help at Discharge: Family;Available 24 hours/day Type of Home: House Home Access: Stairs to enter Enter[employer] CorporationEntrance Stairs-Number of Steps: 1 Entrance Stairs-Rails: None Home Layout: Two level;Bed/bath upstairs Alternate Level Stairs-Number of Steps: 16 Alternate Level Stairs-Rails: Left Bathroom Shower/Tub: Tub/shower unit;Walk-in shower   Bathroom Toilet: Standard     Home Equipment: None          Prior Functioning/Environment  Level of Independence: Independent                 OT Problem List: Impaired balance (sitting and/or standing);Pain;Decreased activity tolerance;Decreased knowledge of use of DME or AE   OT Treatment/Interventions: Self-care/ADL training;DME and/or AE instruction;Therapeutic  activities;Patient/family education    OT Goals(Current goals can be found in the care plan section) Acute Rehab OT Goals Patient Stated Goal: walk without pain OT Goal Formulation: With patient Time For Goal Achievement: 04/03/16 Potential to Achieve Goals: Good ADL Goals Pt Will Perform Grooming: with supervision;with set-up;with caregiver independent in assisting;standing Pt Will Perform Lower Body Bathing: with min assist;with caregiver independent in assisting;sitting/lateral leans;sit to/from stand Pt Will Perform Lower Body Dressing: with min assist;with caregiver independent in assisting;sitting/lateral leans;sit to/from stand Pt Will Transfer to Toilet: with supervision;ambulating;grab bars Pt Will Perform Toileting - Clothing Manipulation and hygiene: with min guard assist;with caregiver independent in assisting;sit to/from stand Pt Will Perform Tub/Shower Transfer: with supervision;ambulating;rolling walker;shower seat;3 in 1;with caregiver independent in assisting  OT Frequency: Min 2X/week   Barriers to D/C:    no barriers                     End of Session Equipment Utilized During Treatment: Gait belt;Rolling walker;Other (comment) (3 in 1)  Activity Tolerance: Patient tolerated treatment well Patient left: in chair;with call bell/phone within reach;with family/visitor present   Time: 1610-9604 OT Time Calculation (min): 27 min Charges:  OT General Charges $OT Visit: 1 Procedure OT Evaluation $OT Eval Moderate Complexity: 1 Procedure OT Treatments $Therapeutic Activity: 8-22 mins G-Codes:    Galen Manila 03/27/2016, 1:55 PM

## 2016-03-27 NOTE — Care Management Note (Signed)
Case Management Note  Patient Details  Name: Tami Baker MRN: [id number] Date of Birth: 12/27/1950  Subjective/Objective:  66 yr old female s/p right total hip arthroplasty.                   Action/Plan: Case manager spoke with patient concerning Home Health and DME needs. [facility] [place], no changes. CM has faxed orders and demographiscs,F2F [facility] [place] at [phone]. DME has been delivered to patient's room. She will have support at discharge.   Expected Discharge Date:    03/28/16              Expected Discharge Plan:  Home w Home Health Services  In-House Referral:     Discharge planning Services  CM Consult  Post Acute Care Choice:  Home Health, Durable Medical Equipment Choice offered to:  Patient  DME Arranged:  3-N-1, Walker rolling DME Agency:  [employer]  HH Arranged:  PT HH Agency:  (S) Other - See comment ([facility] in [place])  Status of Service:  Completed, signed off  If discussed at Long Length of Stay Meetings, dates discussed:    Additional Comments:  Durenda GuthrieBrady, Audric Venn Naomi, RN 03/27/2016, 12:27 PM

## 2016-03-27 NOTE — Progress Notes (Signed)
Physical Therapy Treatment Patient Details Name: Tami Baker MRN: [id number] DOB: 07/20/1950 Today's Date: 03/27/2016    History of Present Illness Admitted for R THA, WBAT, Direct Ant;  has a past medical history of Hyperlipemia; Hypertension; and Hypothyroidism.      PT Comments    Continuing improvements in gait and activity tolerance; Performed stairs again; She is doing well monitoring her R ankle active dorsiflexion and performing calf stretches; on track for dc home tomorrow  Follow Up Recommendations  Home health PT;Supervision/Assistance - 24 hour     Equipment Recommendations  Rolling walker with 5" wheels;3in1 (PT) (delivered to room)    Recommendations for Other Services       Precautions / Restrictions Precautions Precautions: None Restrictions Weight Bearing Restrictions: Yes RLE Weight Bearing: Weight bearing as tolerated    Mobility  Bed Mobility Overal bed mobility: Needs Assistance Bed Mobility: Sit to Sidelying Rolling: Supervision       Sit to sidelying: Supervision General bed mobility comments: Cues for sit to sidelie technique; performed very well  Transfers Overall transfer level: Needs assistance Equipment used: Rolling walker (2 wheeled) Transfers: Sit to/from Stand Sit to Stand: Supervision         General transfer comment: Cues for hand placement and sfatey  Ambulation/Gait Ambulation/Gait assistance: Supervision Ambulation Distance (Feet): 70 Feet Assistive device: Rolling walker (2 wheeled) Gait Pattern/deviations: Step-through pattern;Decreased dorsiflexion - right     General Gait Details: Cues to self-monitor for activity tolerance; noted decr R ankle dorsiflexion with decr heel strike, but pt gave subjective report of feeling like she had more control   Stairs Stairs: Yes   Stair Management: One rail Right;Sideways;Step to pattern Number of Stairs: 5 General stair comments: More confident this afternoon  Wheelchair  Mobility    Modified Rankin (Stroke Patients Only)       Balance                                    Cognition Arousal/Alertness: Awake/alert Behavior During Therapy: WFL for tasks assessed/performed Overall Cognitive Status: Within Functional Limits for tasks assessed                      Exercises      General Comments        Pertinent Vitals/Pain Pain Assessment: Faces Pain Score: 3  Faces Pain Scale: Hurts a little bit Pain Location: R hip Pain Descriptors / Indicators: Aching;Sore Pain Intervention(s): Monitored during session    Home Living Family/patient expects to be discharged to:: Private residence Living Arrangements: Spouse/significant other Available Help at Discharge: Family;Available 24 hours/day Type of Home: House Home Access: Stairs to enter Entrance Stairs-Rails: None Home Layout: Two level;Bed/bath upstairs Home Equipment: None      Prior Function Level of Independence: Independent          PT Goals (current goals can now be found in the care plan section) Acute Rehab PT Goals Patient Stated Goal: walk without pain PT Goal Formulation: With patient Time For Goal Achievement: 04/02/16 Potential to Achieve Goals: Good Progress towards PT goals: Progressing toward goals    Frequency    7X/week      PT Plan Current plan remains appropriate    Co-evaluation             End of Session           Time: 1443-1510 PT Time  Calculation (min) (ACUTE ONLY): 27 min  Charges:  $Gait Training: 23-37 mins $Therapeutic Activity: 8-22 mins                    G Codes:      Levi Aland 04/11/16, 3:28 PM  Van Clines, PT  Acute Rehabilitation Services Pager [phone] Office [phone]

## 2016-03-27 NOTE — Progress Notes (Signed)
Subjective: 1 Day Post-Op Procedure(s) (LRB): TOTAL HIP ARTHROPLASTY ANTERIOR APPROACH (Right)  Activity level:  wbat Diet tolerance:  ok Voiding:  ok Patient reports pain as mild.    Objective: Vital signs in last 24 hours: Temp:  [97 F (36.1 C)-98.3 F (36.8 C)] 98.3 F (36.8 C) (01/17 0607) Pulse Rate:  [49-75] 71 (01/17 0607) Resp:  [11-22] 16 (01/17 0607) BP: (99-135)/(33-62) 126/52 (01/17 0607) SpO2:  [95 %-100 %] 95 % (01/17 0607)  Labs: No results for input(s): HGB in the last 72 hours. No results for input(s): WBC, RBC, HCT, PLT in the last 72 hours. No results for input(s): NA, K, CL, CO2, BUN, CREATININE, GLUCOSE, CALCIUM in the last 72 hours. No results for input(s): LABPT, INR in the last 72 hours.  Physical Exam:  Neurologically intact ABD soft Neurovascular intact Sensation intact distally Intact pulses distally Dorsiflexion/Plantar flexion intact Incision: dressing C/D/I and no drainage No cellulitis present Compartment soft  Assessment/Plan:  1 Day Post-Op Procedure(s) (LRB): TOTAL HIP ARTHROPLASTY ANTERIOR APPROACH (Right) Advance diet Up with therapy Plan for discharge tomorrow Discharge home with home health if doing well and cleared by PT.  Follow up in office 2 weeks post op Continue on ASA 325mg  BID x 4 weeks post op.  Ismael Karge, Ginger OrganNDREW PAUL 03/27/2016, 7:50 AM

## 2016-03-28 ENCOUNTER — Encounter (HOSPITAL_COMMUNITY): Payer: Self-pay | Admitting: Orthopaedic Surgery

## 2016-03-28 MED ORDER — METHOCARBAMOL 500 MG PO TABS: 500.0000 mg | ORAL_TABLET | Freq: Four times a day (QID) | ORAL | 0 refills | Status: AC | PRN

## 2016-03-28 MED ORDER — ASPIRIN 325 MG PO TBEC: 325.0000 mg | DELAYED_RELEASE_TABLET | Freq: Two times a day (BID) | ORAL | 0 refills | Status: AC

## 2016-03-28 MED ORDER — TRAMADOL HCL 50 MG PO TABS: 50.0000 mg | ORAL_TABLET | Freq: Four times a day (QID) | ORAL | 0 refills | Status: AC

## 2016-03-28 NOTE — Progress Notes (Signed)
Discharge instructions and medications reviewed with patient. Patient verbalized understanding. Patient is dressed and eating lunch before leaving.

## 2016-03-28 NOTE — Progress Notes (Signed)
Physical Therapy Treatment Patient Details Name: Tami Baker MRN: [id number] DOB: 06/19/1950 Today's Date: 03/28/2016    History of Present Illness Admitted for R THA, WBAT, Direct Ant;  has a past medical history of Hyperlipemia; Hypertension; and Hypothyroidism.      PT Comments    Patient continues to progress toward mobility goals. Pt demonstrated decreased R DF however is improving from yesterday. Current plan remains appropriate.   Follow Up Recommendations  Home health PT;Supervision/Assistance - 24 hour     Equipment Recommendations  Rolling walker with 5" wheels;3in1 (PT)    Recommendations for Other Services       Precautions / Restrictions Precautions Precautions: None Restrictions Weight Bearing Restrictions: Yes RLE Weight Bearing: Weight bearing as tolerated    Mobility  Bed Mobility               General bed mobility comments: N/A; pt OOB in chair upon arrival  Transfers Overall transfer level: Needs assistance Equipment used: Rolling walker (2 wheeled) Transfers: Sit to/from Stand Sit to Stand: Supervision         General transfer comment: carry over of safe hand placement  Ambulation/Gait Ambulation/Gait assistance: Supervision Ambulation Distance (Feet): 200 Feet Assistive device: Rolling walker (2 wheeled) Gait Pattern/deviations: Step-through pattern;Decreased dorsiflexion - right     General Gait Details: slow, steady gait; decreased DF R side however improved from yesterday; pt has good safety awareness and ability to clear floor with R foot    Stairs Stairs: Yes   Stair Management: One rail Right;Sideways;Step to pattern Number of Stairs: 5 General stair comments: carry over sequencing and good safety awareness  Wheelchair Mobility    Modified Rankin (Stroke Patients Only)       Balance                                    Cognition Arousal/Alertness: Awake/alert Behavior During Therapy: WFL for tasks  assessed/performed Overall Cognitive Status: Within Functional Limits for tasks assessed                      Exercises Total Joint Exercises Hip ABduction/ADduction: AROM;Right;10 reps;Standing Knee Flexion: AROM;Right;10 reps;Standing Marching in Standing: AROM;Right;10 reps;Standing    General Comments General comments (skin integrity, edema, etc.): given HEP handout and reviewed      Pertinent Vitals/Pain Pain Assessment: Faces Faces Pain Scale: Hurts a little bit Pain Location: R thigh Pain Descriptors / Indicators: Sore;Tightness (numbness/tightness in calf) Pain Intervention(s): Limited activity within patient's tolerance;Monitored during session;Premedicated before session;Repositioned;Ice applied    Home Living                      Prior Function            PT Goals (current goals can now be found in the care plan section) Acute Rehab PT Goals Patient Stated Goal: walk without pain PT Goal Formulation: With patient Time For Goal Achievement: 04/02/16 Potential to Achieve Goals: Good Progress towards PT goals: Progressing toward goals    Frequency    7X/week      PT Plan Current plan remains appropriate    Co-evaluation             End of Session Equipment Utilized During Treatment: Gait belt Activity Tolerance: Patient tolerated treatment well Patient left: in chair;with call bell/phone within reach;with family/visitor present     Time: 1025-1100 PT Time  Calculation (min) (ACUTE ONLY): 35 min  Charges:  $Gait Training: 8-22 mins $Therapeutic Exercise: 8-22 mins                    G Codes:      Derek Mound, PTA Pager: [phone]   03/28/2016, 11:13 AM

## 2016-03-28 NOTE — Discharge Summary (Signed)
Patient ID: Tami Baker MRN: [id number] DOB/AGE: Dec 05, 1950 66 y.o.  Admit date: 03/26/2016 Discharge date: 03/28/2016  Admission Diagnoses:  Principal Problem:   Primary osteoarthritis of right hip   Discharge Diagnoses:  Same  Past Medical History:  Diagnosis Date  . Arthritis    osteo  . Hyperlipemia   . Hypertension   . Hypothyroidism     Surgeries: Procedure(s): TOTAL HIP ARTHROPLASTY ANTERIOR APPROACH on 03/26/2016   Consultants:   Discharged Condition: Improved  Hospital Course: Tami Baker is an 66 y.o. female who was admitted 03/26/2016 for operative treatment ofPrimary osteoarthritis of right hip. Patient has severe unremitting pain that affects sleep, daily activities, and work/hobbies. After pre-op clearance the patient was taken to the operating room on 03/26/2016 and underwent  Procedure(s): TOTAL HIP ARTHROPLASTY ANTERIOR APPROACH.    Patient was given perioperative antibiotics: Anti-infectives    Start     Dose/Rate Route Frequency Ordered Stop   03/26/16 1330  clindamycin (CLEOCIN) IVPB 600 mg     600 mg 100 mL/hr over 30 Minutes Intravenous Every 6 hours 03/26/16 1204 03/26/16 1850   03/26/16 0745  clindamycin (CLEOCIN) IVPB 900 mg     900 mg 100 mL/hr over 30 Minutes Intravenous To Surgery 03/26/16 0738 03/26/16 0740       Patient was given sequential compression devices, early ambulation, and chemoprophylaxis to prevent DVT.  Patient benefited maximally from hospital stay and there were no complications.    Recent vital signs: Patient Vitals for the past 24 hrs:  BP Temp Temp src Pulse Resp SpO2  03/28/16 0527 (!) 108/54 98.2 F (36.8 C) Oral 74 16 98 %  03/27/16 2022 (!) 116/51 98.7 F (37.1 C) Oral 69 16 97 %  03/27/16 1647 (!) 120/58 98.2 F (36.8 C) - 72 16 97 %     Recent laboratory studies: No results for input(s): WBC, HGB, HCT, PLT, NA, K, CL, CO2, BUN, CREATININE, GLUCOSE, INR, CALCIUM in the last 72 hours.  Invalid input(s): PT,  2   Discharge Medications:   Allergies as of 03/28/2016      Reactions   Ciprofloxacin Hives, Shortness Of Breath   Codeine Hives, Shortness Of Breath   Iodine Hives, Shortness Of Breath   Ivp Dye [iodinated Diagnostic Agents] Hives, Shortness Of Breath   Levaquin [levofloxacin] Hives, Shortness Of Breath   Penicillins Hives, Shortness Of Breath   Has patient had a PCN reaction causing immediate rash, facial/tongue/throat swelling, SOB or lightheadedness with hypotension: Yes Has patient had a PCN reaction causing severe rash involving mucus membranes or skin necrosis: No Has patient had a PCN reaction that required hospitalization No Has patient had a PCN reaction occurring within the last 10 years: Yes If all of the above answers are "NO", then may proceed with Cephalosporin use.   Shellfish Allergy Hives, Shortness Of Breath      Medication List    TAKE these medications   acetaminophen 500 MG tablet Commonly known as:  TYLENOL Take 1,000 mg by mouth every 6 (six) hours as needed (for pain.).   aspirin 325 MG EC tablet Take 1 tablet (325 mg total) by mouth 2 (two) times daily after a meal. What changed:  medication strength  how much to take  when to take this   BIOTIN 5000 PO Take 5,000 mcg by mouth daily.   ergocalciferol 50000 units capsule Commonly known as:  VITAMIN D2 Take 50,000 Units by mouth 2 (two) times a week. Tuesday & Friday  Fish Oil 1200 MG Caps Take 1,200 mg by mouth daily.   Flaxseed Oil 1200 MG Caps Take 1,200 mg by mouth daily.   levothyroxine 50 MCG tablet Commonly known as:  SYNTHROID, LEVOTHROID Take 50 mcg by mouth daily before breakfast.   losartan-hydrochlorothiazide 50-12.5 MG tablet Commonly known as:  HYZAAR Take 1 tablet by mouth daily.   magnesium gluconate 500 MG tablet Commonly known as:  MAGONATE Take 500 mg by mouth daily.   methocarbamol 500 MG tablet Commonly known as:  ROBAXIN Take 1 tablet (500 mg total) by  mouth every 6 (six) hours as needed for muscle spasms.   multivitamin with minerals Tabs tablet Take 1 tablet by mouth daily. Centrum   Potassium Gluconate 550 MG Tabs Take 550 mg by mouth daily.   pravastatin 20 MG tablet Commonly known as:  PRAVACHOL Take 20 mg by mouth at bedtime.   Red Yeast Rice Extract 600 MG Caps Take 600 mg by mouth daily.   SUPER B COMPLEX/C PO Take 1 tablet by mouth daily.   traMADol 50 MG tablet Commonly known as:  ULTRAM Take 1-2 tablets (50-100 mg total) by mouth every 6 (six) hours.   Vitamin B12 3000 MCG Subl Place 3,000 mcg under the tongue daily.            Durable Medical Equipment        Start     Ordered   03/26/16 1205  DME Walker rolling  Once    Question:  Patient needs a walker to treat with the following condition  Answer:  Primary osteoarthritis of right hip   03/26/16 1204   03/26/16 1205  DME 3 n 1  Once     03/26/16 1204   03/26/16 1205  DME Bedside commode  Once    Question:  Patient needs a bedside commode to treat with the following condition  Answer:  Primary osteoarthritis of right hip   03/26/16 1204      Diagnostic Studies: Dg Chest 2 View  Result Date: 03/15/2016 CLINICAL DATA:  Preoperative evaluation for RIGHT hip replacement, history hypertension EXAM: CHEST  2 VIEW COMPARISON:  None FINDINGS: Normal heart size, mediastinal contours, and pulmonary vascularity. Atherosclerotic calcification aorta. Subsegmental atelectasis versus scarring at LEFT base. Lungs otherwise clear. No pleural effusion or pneumothorax. Bones appear demineralized. IMPRESSION: Subsegmental atelectasis versus scarring at LEFT base. Aortic atherosclerosis. Electronically Signed   By: Ulyses Southward M.D.   On: 03/15/2016 12:18   Dg C-arm 1-60 Min  Result Date: 03/26/2016 CLINICAL DATA:  Right hip replacement EXAM: DG C-ARM 61-120 MIN; OPERATIVE RIGHT HIP WITH PELVIS COMPARISON:  None FLUOROSCOPY TIME:  33 seconds FINDINGS: Right total hip  arthroplasty without failure or complication. No dislocation. IMPRESSION: Right total hip arthroplasty. Electronically Signed   By: Elige Ko   On: 03/26/2016 09:37   Dg Hip Operative Unilat W Or W/o Pelvis Right  Result Date: 03/26/2016 CLINICAL DATA:  Right hip replacement EXAM: DG C-ARM 61-120 MIN; OPERATIVE RIGHT HIP WITH PELVIS COMPARISON:  None FLUOROSCOPY TIME:  33 seconds FINDINGS: Right total hip arthroplasty without failure or complication. No dislocation. IMPRESSION: Right total hip arthroplasty. Electronically Signed   By: Elige Ko   On: 03/26/2016 09:37    Disposition: Final discharge disposition not confirmed  Discharge Instructions    Call MD / Call 911    Complete by:  As directed    If you experience chest pain or shortness of breath, CALL 911 and be  transported to the hospital emergency room.  If you develope a fever above 101 F, pus (white drainage) or increased drainage or redness at the wound, or calf pain, call your surgeon's office.   Constipation Prevention    Complete by:  As directed    Drink plenty of fluids.  Prune juice may be helpful.  You may use a stool softener, such as Colace (over the counter) 100 mg twice a day.  Use MiraLax (over the counter) for constipation as needed.   Diet - low sodium heart healthy    Complete by:  As directed    Discharge instructions    Complete by:  As directed    INSTRUCTIONS AFTER JOINT REPLACEMENT   Remove items at home which could result in a fall. This includes throw rugs or furniture in walking pathways ICE to the affected joint every three hours while awake for 30 minutes at a time, for at least the first 3-5 days, and then as needed for pain and swelling.  Continue to use ice for pain and swelling. You may notice swelling that will progress down to the foot and ankle.  This is normal after surgery.  Elevate your leg when you are not up walking on it.   Continue to use the breathing machine you got in the hospital  (incentive spirometer) which will help keep your temperature down.  It is common for your temperature to cycle up and down following surgery, especially at night when you are not up moving around and exerting yourself.  The breathing machine keeps your lungs expanded and your temperature down.   DIET:  As you were doing prior to hospitalization, we recommend a well-balanced diet.  DRESSING / WOUND CARE / SHOWERING  You may shower 3 days after surgery, but keep the wounds dry during showering.  You may use an occlusive plastic wrap (Press'n Seal for example), NO SOAKING/SUBMERGING IN THE BATHTUB.  If the bandage gets wet, change with a clean dry gauze.  If the incision gets wet, pat the wound dry with a clean towel.  ACTIVITY  Increase activity slowly as tolerated, but follow the weight bearing instructions below.   No driving for 6 weeks or until further direction given by your physician.  You cannot drive while taking narcotics.  No lifting or carrying greater than 10 lbs. until further directed by your surgeon. Avoid periods of inactivity such as sitting longer than an hour when not asleep. This helps prevent blood clots.  You may return to work once you are authorized by your doctor.     WEIGHT BEARING   Weight bearing as tolerated with assist device (walker, cane, etc) as directed, use it as long as suggested by your surgeon or therapist, typically at least 4-6 weeks.   EXERCISES  Results after joint replacement surgery are often greatly improved when you follow the exercise, range of motion and muscle strengthening exercises prescribed by your doctor. Safety measures are also important to protect the joint from further injury. Any time any of these exercises cause you to have increased pain or swelling, decrease what you are doing until you are comfortable again and then slowly increase them. If you have problems or questions, call your caregiver or physical therapist for advice.    Rehabilitation is important following a joint replacement. After just a few days of immobilization, the muscles of the leg can become weakened and shrink (atrophy).  These exercises are designed to build up the tone and  strength of the thigh and leg muscles and to improve motion. Often times heat used for twenty to thirty minutes before working out will loosen up your tissues and help with improving the range of motion but do not use heat for the first two weeks following surgery (sometimes heat can increase post-operative swelling).   These exercises can be done on a training (exercise) mat, on the floor, on a table or on a bed. Use whatever works the best and is most comfortable for you.    Use music or television while you are exercising so that the exercises are a pleasant break in your day. This will make your life better with the exercises acting as a break in your routine that you can look forward to.   Perform all exercises about fifteen times, three times per day or as directed.  You should exercise both the operative leg and the other leg as well.   Exercises include:   Quad Sets - Tighten up the muscle on the front of the thigh (Quad) and hold for 5-10 seconds.   Straight Leg Raises - With your knee straight (if you were given a brace, keep it on), lift the leg to 60 degrees, hold for 3 seconds, and slowly lower the leg.  Perform this exercise against resistance later as your leg gets stronger.  Leg Slides: Lying on your back, slowly slide your foot toward your buttocks, bending your knee up off the floor (only go as far as is comfortable). Then slowly slide your foot back down until your leg is flat on the floor again.  Angel Wings: Lying on your back spread your legs to the side as far apart as you can without causing discomfort.  Hamstring Strength:  Lying on your back, push your heel against the floor with your leg straight by tightening up the muscles of your buttocks.  Repeat, but this  time bend your knee to a comfortable angle, and push your heel against the floor.  You may put a pillow under the heel to make it more comfortable if necessary.   A rehabilitation program following joint replacement surgery can speed recovery and prevent re-injury in the future due to weakened muscles. Contact your doctor or a physical therapist for more information on knee rehabilitation.    CONSTIPATION  Constipation is defined medically as fewer than three stools per week and severe constipation as less than one stool per week.  Even if you have a regular bowel pattern at home, your normal regimen is likely to be disrupted due to multiple reasons following surgery.  Combination of anesthesia, postoperative narcotics, change in appetite and fluid intake all can affect your bowels.   YOU MUST use at least one of the following options; they are listed in order of increasing strength to get the job done.  They are all available over the counter, and you may need to use some, POSSIBLY even all of these options:    Drink plenty of fluids (prune juice may be helpful) and high fiber foods Colace 100 mg by mouth twice a day  Senokot for constipation as directed and as needed Dulcolax (bisacodyl), take with full glass of water  Miralax (polyethylene glycol) once or twice a day as needed.  If you have tried all these things and are unable to have a bowel movement in the first 3-4 days after surgery call either your surgeon or your primary doctor.    If you experience loose stools or  diarrhea, hold the medications until you stool forms back up.  If your symptoms do not get better within 1 week or if they get worse, check with your doctor.  If you experience "the worst abdominal pain ever" or develop nausea or vomiting, please contact the office immediately for further recommendations for treatment.   ITCHING:  If you experience itching with your medications, try taking only a single pain pill, or even  half a pain pill at a time.  You can also use Benadryl over the counter for itching or also to help with sleep.   TED HOSE STOCKINGS:  Use stockings on both legs until for at least 2 weeks or as directed by physician office. They may be removed at night for sleeping.  MEDICATIONS:  See your medication summary on the "After Visit Summary" that nursing will review with you.  You may have some home medications which will be placed on hold until you complete the course of blood thinner medication.  It is important for you to complete the blood thinner medication as prescribed.  PRECAUTIONS:  If you experience chest pain or shortness of breath - call 911 immediately for transfer to the hospital emergency department.   If you develop a fever greater that 101 F, purulent drainage from wound, increased redness or drainage from wound, foul odor from the wound/dressing, or calf pain - CONTACT YOUR SURGEON.                                                   FOLLOW-UP APPOINTMENTS:  If you do not already have a post-op appointment, please call the office for an appointment to be seen by your surgeon.  Guidelines for how soon to be seen are listed in your "After Visit Summary", but are typically between 1-4 weeks after surgery.  OTHER INSTRUCTIONS:   Knee Replacement:  Do not place pillow under knee, focus on keeping the knee straight while resting. CPM instructions: 0-90 degrees, 2 hours in the morning, 2 hours in the afternoon, and 2 hours in the evening. Place foam block, curve side up under heel at all times except when in CPM or when walking.  DO NOT modify, tear, cut, or change the foam block in any way.  MAKE SURE YOU:  Understand these instructions.  Get help right away if you are not doing well or get worse.    Thank you for letting us be a part of your medical care team.  It is a privilege we respect greatly.  We hope these instructions will help you stay on track for a fast and full recovery!    Increase activity slowly as tolerated    Complete by:  As directed       Follow-up Information    DALLDORF,PETER G, MD. Schedule an appointment as soon as possible for a visit in 2 week(s).   Specialty:  Orthopedic Surgery Contact information: [address] [place] [address] [phone]        [facility] Follow up.   Why:  [facility] will contact you to arrange start date and time for therapy.           Signed: Drema HalonIDA, Careli Luzader PAUL 03/28/2016, 12:20 PM

## 2016-03-28 NOTE — Progress Notes (Signed)
Occupational Therapy Treatment Patient Details Name: Tami Baker MRN: [id number] DOB: 1950-04-26 Today's Date: 03/28/2016    History of present illness Admitted for R THA, WBAT, Direct Ant;  has a past medical history of Hyperlipemia; Hypertension; and Hypothyroidism.     OT comments  Pt making good progress with functional goals. Pt participated in bathing and dressing in bathroom standing at sink. Pt to d/c home today  Follow Up Recommendations  Supervision - Intermittent    Equipment Recommendations  3 in 1 bedside commode;Other (comment);Tub/shower seat Lexicographer)    Recommendations for Other Services      Precautions / Restrictions Precautions Precautions: None Restrictions Weight Bearing Restrictions: Yes RLE Weight Bearing: Weight bearing as tolerated       Mobility Bed Mobility               General bed mobility comments: pt up in recliner  Transfers Overall transfer level: Needs assistance Equipment used: Rolling walker (2 wheeled) Transfers: Sit to/from Stand Sit to Stand: Supervision         General transfer comment: carry over of safe hand placement    Balance                                   ADL Overall ADL's : Needs assistance/impaired     Grooming: Wash/dry hands;Wash/dry face;Standing;Supervision/safety;Set up   Upper Body Bathing: Set up;Supervision/ safety;Standing   Lower Body Bathing: Minimal assistance;Sit to/from stand   Upper Body Dressing : Supervision/safety;Set up;Standing   Lower Body Dressing: Sit to/from stand;Minimal assistance   Toilet Transfer: Ambulation;RW;Comfort height toilet;Supervision/safety   Toileting- Clothing Manipulation and Hygiene: Min guard;Sit to/from stand   Tub/ Engineer, structural: 3 in 1;Ambulation;Rolling walker;Supervision/safety   Functional mobility during ADLs: Supervision/safety;Rolling walker General ADL Comments: pt participated in bathing and dressing standing at sink       Vision                     Perception     Praxis      Cognition   Behavior During Therapy: [facility] for tasks assessed/performed Overall Cognitive Status: Within Functional Limits for tasks assessed                       Extremity/Trunk Assessment   WFL                       General Comments  pt very pleasant and cooperative    Pertinent Vitals/ Pain       Pain Assessment: 0-10 Pain Score: 2  Faces Pain Scale: Hurts a little bit Pain Location: R LE Pain Descriptors / Indicators: Sore Pain Intervention(s): Monitored during session;Premedicated before session;Repositioned                                                          Frequency  Min 2X/week        Progress Toward Goals  OT Goals(current goals can now be found in the care plan section)  Progress towards OT goals: Progressing toward goals  Acute Rehab OT Goals Patient Stated Goal: walk without pain  Plan Discharge plan remains appropriate  End of Session Equipment Utilized During Treatment: Rolling walker;Other (comment) (3 in 1)   Activity Tolerance Patient tolerated treatment well   Patient Left in chair;with call bell/phone within reach;with family/visitor present             Time: [id number] OT Time Calculation (min): 25 min  Charges: OT General Charges $OT Visit: 1 Procedure OT Treatments $Self Care/Home Management : 23-37 mins  Galen ManilaSpencer, Lashaya Kienitz Jeanette 03/28/2016, 12:52 PM

## 2016-08-10 ENCOUNTER — Encounter (HOSPITAL_COMMUNITY): Payer: Self-pay | Admitting: Orthopaedic Surgery

## 2016-08-10 NOTE — Addendum Note (Signed)
Addendum  created 08/10/16 0820 by Heather RobertsSinger, Amberlea Spagnuolo, MD   Sign clinical note

## 2017-08-11 ENCOUNTER — Encounter (INDEPENDENT_AMBULATORY_CARE_PROVIDER_SITE_OTHER): Payer: Medicare Other | Admitting: Neurology

## 2017-08-11 ENCOUNTER — Ambulatory Visit (INDEPENDENT_AMBULATORY_CARE_PROVIDER_SITE_OTHER): Payer: Medicare Other | Admitting: Neurology

## 2017-08-11 DIAGNOSIS — R202 Paresthesia of skin: Secondary | ICD-10-CM

## 2017-08-11 DIAGNOSIS — Z0289 Encounter for other administrative examinations: Secondary | ICD-10-CM

## 2017-08-11 NOTE — Procedures (Signed)
     HISTORY:   Tami Baker is a 67 year old patient with a history of a total hip replacement on the right that was done in January 2018.  Immediately after the procedure, the patient was noted to have a foot drop with severe pain at the right lateral knee.  Over time, the foot drop has improved, the knee pain is improved and she has some residual numbness in the dorsum of the right foot laterally and in the lateral ankle.  The patient is being evaluated for a possible neuropathy or a radiculopathy.  NERVE CONDUCTION STUDIES:  Nerve conduction studies were performed on both lower extremities. The distal motor latencies and motor amplitudes for the peroneal and posterior tibial nerves were within normal limits. The nerve conduction velocities for these nerves were also normal. The sensory latencies for the peroneal and sural nerves were within normal limits.  The F-wave latencies for the posterior tibial nerves were within normal limits bilaterally.   EMG STUDIES:  EMG study was performed on the right lower extremity:  The tibialis anterior muscle reveals 2 to 4K motor units with full recruitment. No fibrillations or positive waves were seen. The peroneus tertius muscle reveals 2 to 5K motor units with slightly decreased recruitment. No fibrillations or positive waves were seen. The medial gastrocnemius muscle reveals 1 to 3K motor units with full recruitment. No fibrillations or positive waves were seen. The vastus lateralis muscle reveals 2 to 4K motor units with full recruitment. No fibrillations or positive waves were seen. The iliopsoas muscle reveals 2 to 4K motor units with full recruitment. No fibrillations or positive waves were seen. The biceps femoris muscle (long head) reveals 2 to 4K motor units with full recruitment. No fibrillations or positive waves were seen. The lumbosacral paraspinal muscles were tested at 3 levels, and revealed no abnormalities of insertional activity at  all 3 levels tested. There was good relaxation.   IMPRESSION:  Nerve conduction studies done on both lower extremities were within normal limits, no evidence of a neuropathy is seen.  EMG evaluation of the right lower extremity shows isolated chronic stable signs of denervation in the peroneus tertius muscle.  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  The right peroneal sensory latency is borderline normal, well within normal limits on the left.  Marlan Palau. Keith Willis MD 08/11/2017 1:38 PM  [facility] [address] [place], [place] [id number]  Phone [phone] Fax [phone]

## 2017-08-11 NOTE — Progress Notes (Signed)
[facility]    Nerve / Sites Muscle Latency Ref. Amplitude Ref. Rel Amp Segments Distance Velocity Ref. Area    ms ms mV mV %  cm m/s m/s mVms  R Peroneal - EDB     Ankle EDB 5.1 ?6.5 2.6 ?2.0 100 Ankle - EDB 9   8.5     Fib head EDB 11.8  2.3  90.4 Fib head - Ankle 30 45 ?44 7.7     Pop fossa EDB 14.0  2.5  109 Pop fossa - Fib head 10 45 ?44 8.4         Pop fossa - Ankle      L Peroneal - EDB     Ankle EDB 4.9 ?6.5 4.8 ?2.0 100 Ankle - EDB 9   14.6     Fib head EDB 11.1  4.6  94.7 Fib head - Ankle 30 48 ?44 14.0     Pop fossa EDB 13.2  4.8  104 Pop fossa - Fib head 10 47 ?44 16.4         Pop fossa - Ankle      R Tibial - AH     Ankle AH 4.1 ?5.8 12.9 ?4.0 100 Ankle - AH 9   39.6     Pop fossa AH 12.8  9.2  71 Pop fossa - Ankle 36 41 ?41 31.8  L Tibial - AH     Ankle AH 4.8 ?5.8 17.2 ?4.0 100 Ankle - AH 9   41.6     Pop fossa AH 13.1  11.9  69 Pop fossa - Ankle 36 43 ?41 36.3             SNC    Nerve / Sites Rec. Site Peak Lat Ref.  Amp Ref. Segments Distance    ms ms V V  cm  R Sural - Ankle (Calf)     Calf Ankle 4.3 ?4.4 6 ?6 Calf - Ankle 14  L Sural - Ankle (Calf)     Calf Ankle 3.6 ?4.4 7 ?6 Calf - Ankle 14  R Superficial peroneal - Ankle     Lat leg Ankle 4.4 ?4.4 2 ?6 Lat leg - Ankle 14  L Superficial peroneal - Ankle     Lat leg Ankle 3.8 ?4.4 8 ?6 Lat leg - Ankle 14              F  Wave    Nerve F Lat Ref.   ms ms  R Tibial - AH 50.4 ?56.0  L Tibial - AH 50.5 ?56.0

## 2018-04-21 IMAGING — CR DG CHEST 2V
2 series · 2 of 2 positions shown · non-contrast
Comparison: None

CLINICAL DATA: Preoperative evaluation for RIGHT hip replacement,
history hypertension

EXAM:
CHEST  2 VIEW

[w chest pa]
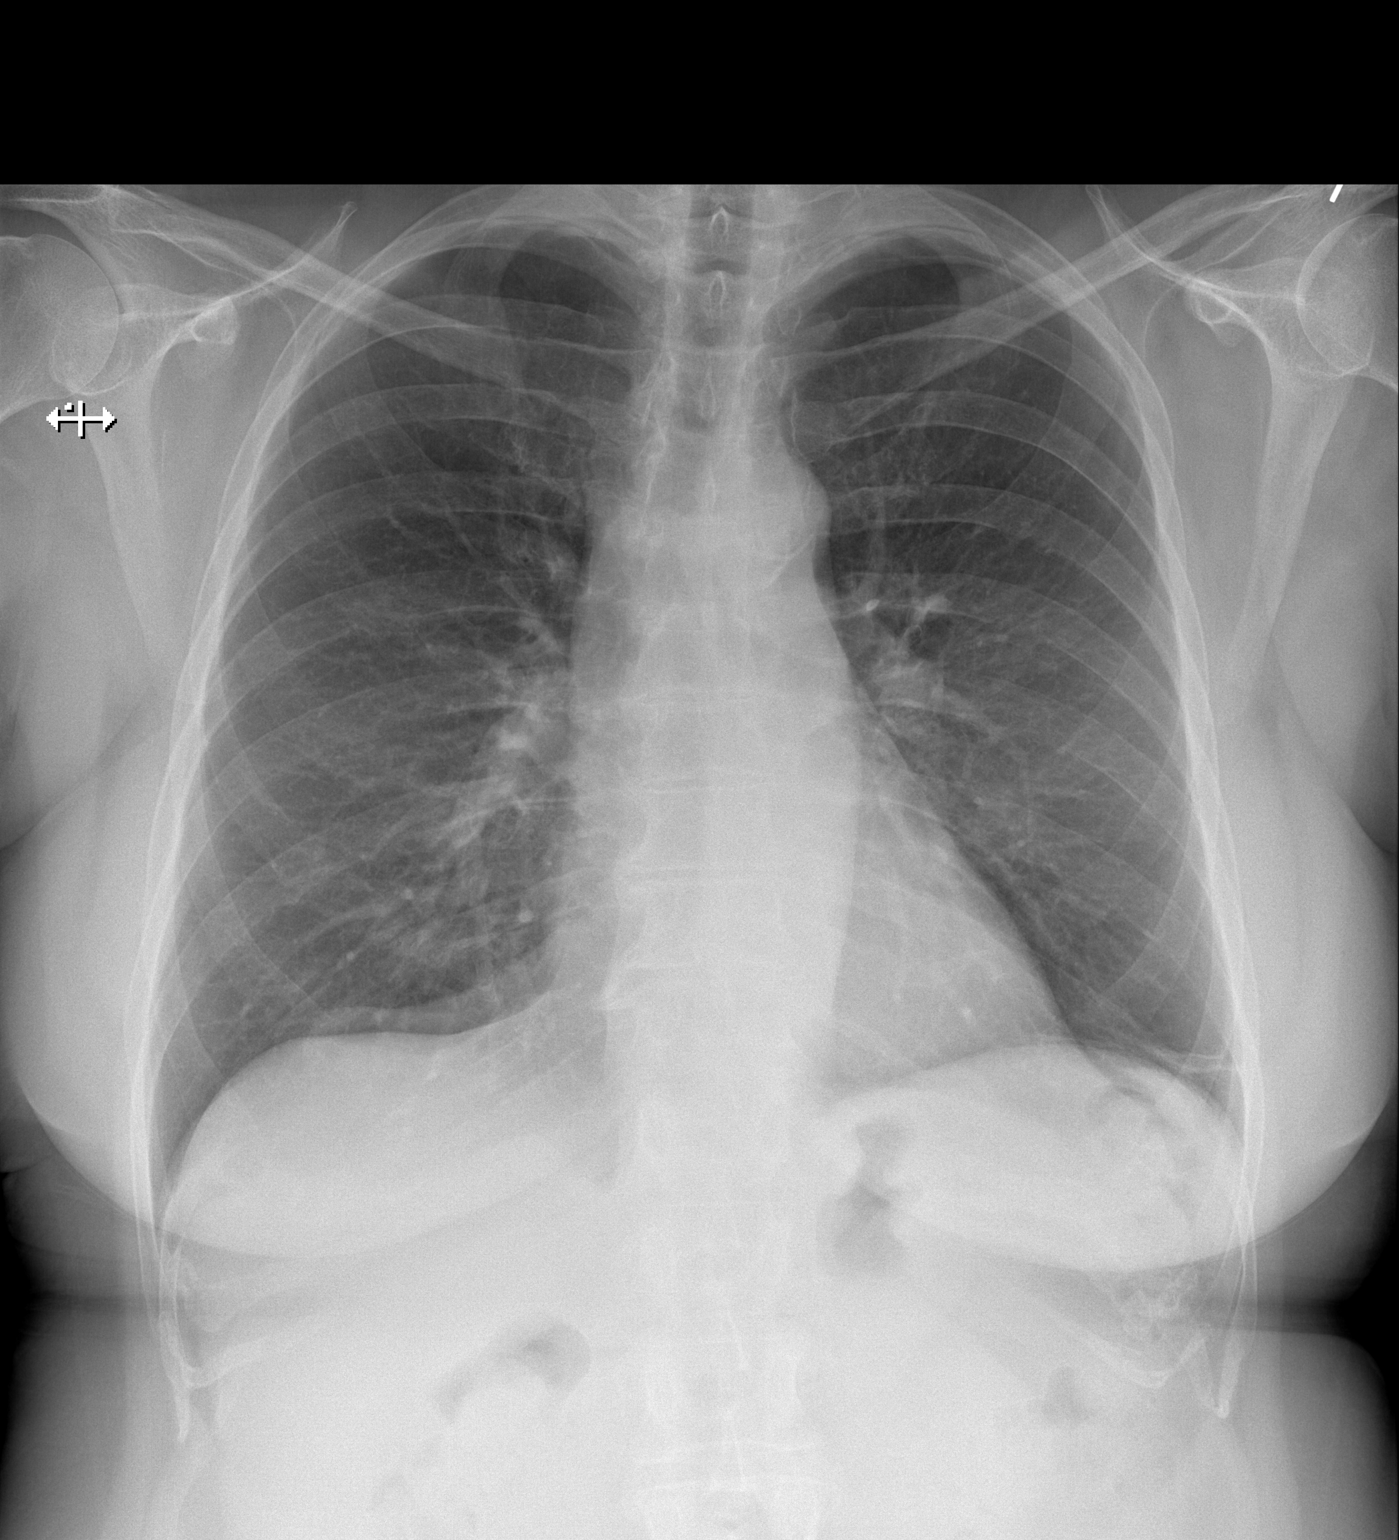

[w chest lat]
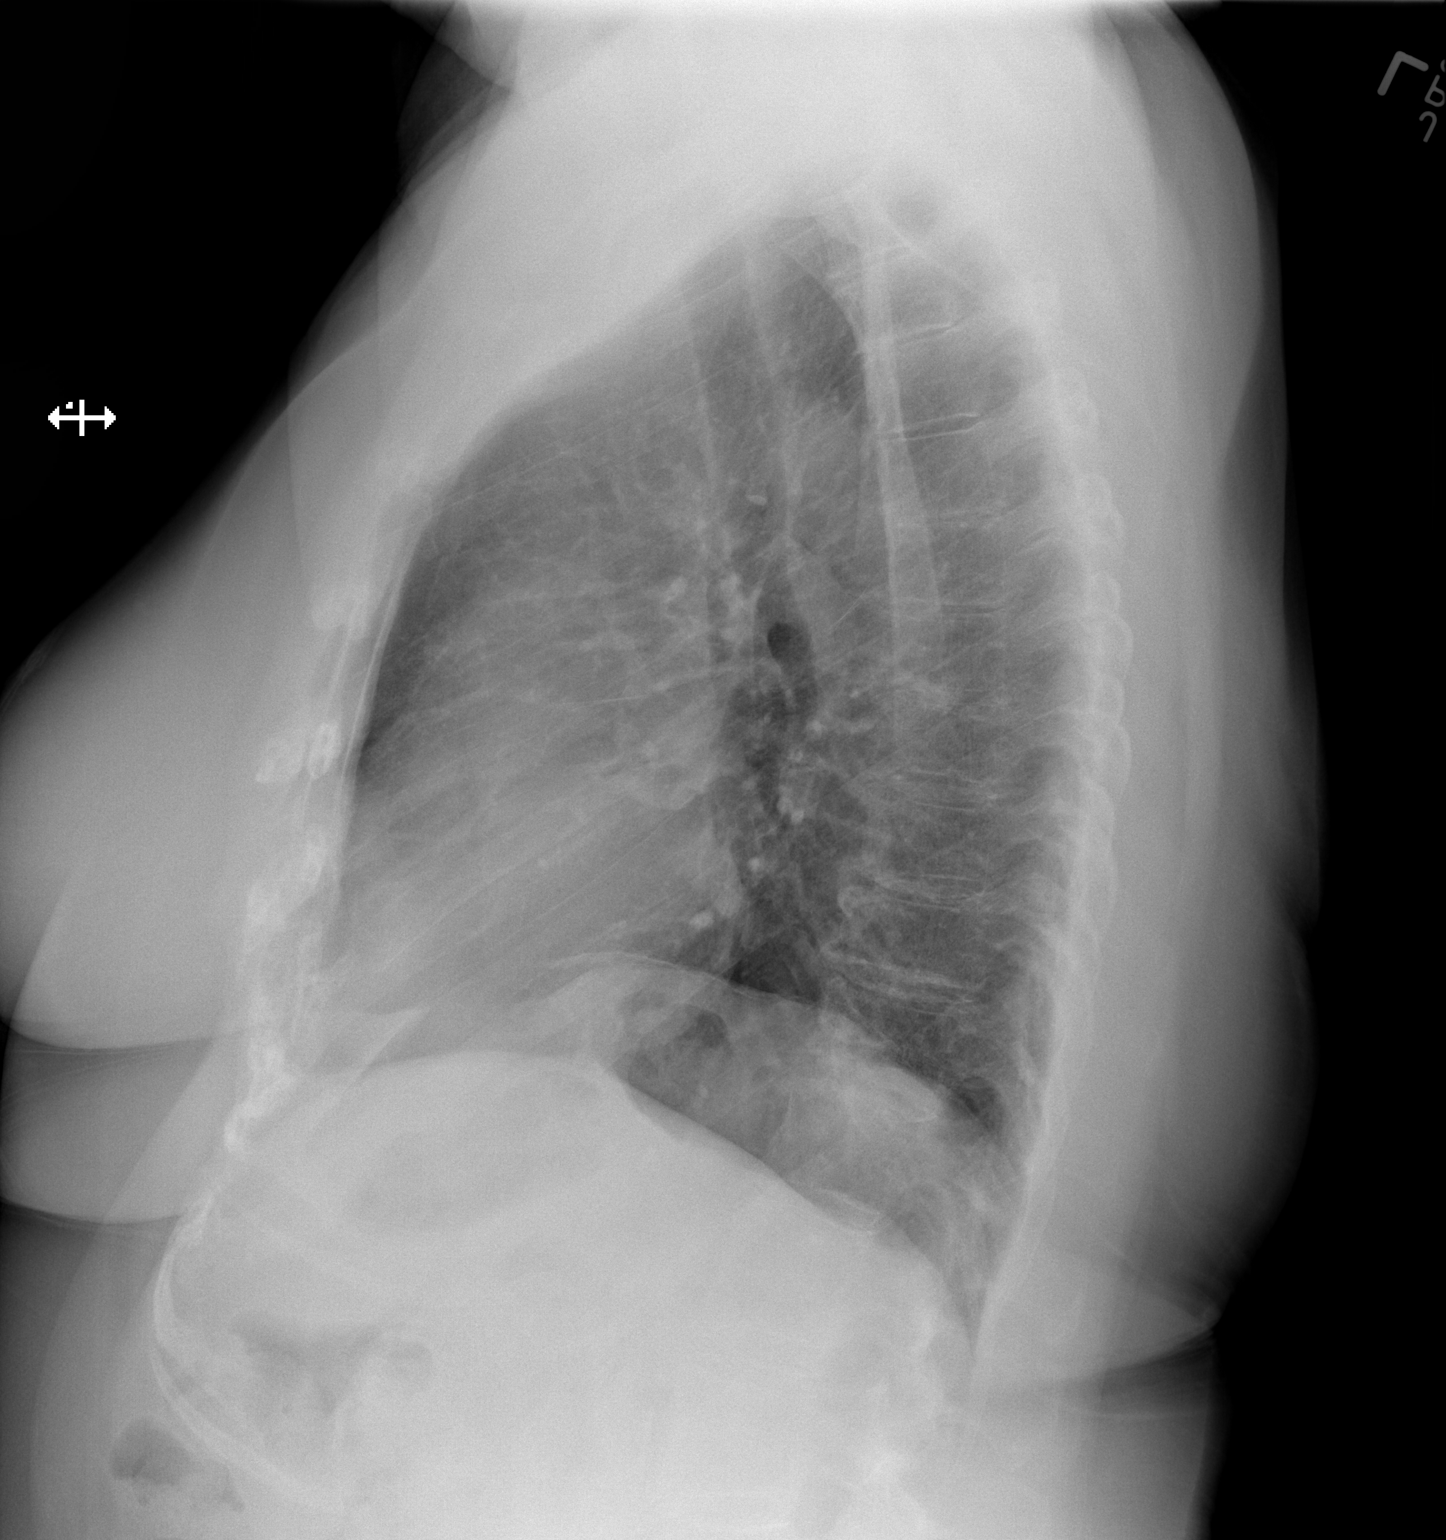

[2 of 2 positions shown; findings below may reference images not displayed]

FINDINGS: Normal heart size, mediastinal contours, and pulmonary vascularity.

Atherosclerotic calcification aorta.

Subsegmental atelectasis versus scarring at LEFT base.

Lungs otherwise clear.

No pleural effusion or pneumothorax.

Bones appear demineralized.
IMPRESSION: Subsegmental atelectasis versus scarring at LEFT base.

Aortic atherosclerosis.

## 2019-05-07 ENCOUNTER — Ambulatory Visit: Payer: Medicare Other | Attending: Internal Medicine

## 2019-05-07 DIAGNOSIS — Z23 Encounter for immunization: Secondary | ICD-10-CM | POA: Insufficient documentation

## 2019-05-07 NOTE — Progress Notes (Signed)
   Covid-19 Vaccination Clinic  Name:  Tami Baker    MRN: [id number] DOB: November 11, 1950  05/07/2019  Tami Baker was observed post Covid-19 immunization for 30 minutes based on pre-vaccination screening without incidence. She was provided with Vaccine Information Sheet and instruction to access the V-Safe system.   Tami Baker was instructed to call 911 with any severe reactions post vaccine: Marland Kitchen Difficulty breathing  . Swelling of your face and throat  . A fast heartbeat  . A bad rash all over your body  . Dizziness and weakness    Immunizations Administered    Name Date Dose VIS Date Route   Pfizer COVID-19 Vaccine 05/07/2019 12:50 PM 0.3 mL 02/19/2019 Intramuscular   Manufacturer: [employer], [employer]   Lot: [id number]   NDC: [address]-1

## 2019-06-01 ENCOUNTER — Ambulatory Visit: Payer: Medicare Other | Attending: Internal Medicine

## 2019-06-01 DIAGNOSIS — Z23 Encounter for immunization: Secondary | ICD-10-CM

## 2019-06-01 NOTE — Progress Notes (Signed)
   Covid-19 Vaccination Clinic  Name:  Harjot Zavadil    MRN: [id number] DOB: [birth year]  06/01/2019  Ms. Grater was observed post Covid-19 immunization for 30 minutes based on pre-vaccination screening without incident. She was provided with Vaccine Information Sheet and instruction to access the V-Safe system.   Ms. Sullivant was instructed to call 911 with any severe reactions post vaccine: Marland Kitchen Difficulty breathing  . Swelling of face and throat  . A fast heartbeat  . A bad rash all over body  . Dizziness and weakness   Immunizations Administered    Name Date Dose VIS Date Route   Pfizer COVID-19 Vaccine 06/01/2019  2:17 PM 0.3 mL 02/19/2019 Intramuscular   Manufacturer: [employer], [employer]   Lot: [id number]   NDC: [address]-2
